# Patient Record
Sex: Male | Born: 1955
Health system: Southern US, Community
[De-identification: ages and names within clinical notes are randomized; demographics above are authoritative.]

## PROBLEM LIST (undated history)

## (undated) DIAGNOSIS — M1990 Unspecified osteoarthritis, unspecified site: Secondary | ICD-10-CM

## (undated) DIAGNOSIS — E119 Type 2 diabetes mellitus without complications: Secondary | ICD-10-CM

## (undated) DIAGNOSIS — F419 Anxiety disorder, unspecified: Secondary | ICD-10-CM

## (undated) DIAGNOSIS — Z87442 Personal history of urinary calculi: Secondary | ICD-10-CM

## (undated) DIAGNOSIS — J45909 Unspecified asthma, uncomplicated: Secondary | ICD-10-CM

## (undated) DIAGNOSIS — E7800 Pure hypercholesterolemia, unspecified: Secondary | ICD-10-CM

## (undated) DIAGNOSIS — I10 Essential (primary) hypertension: Secondary | ICD-10-CM

## (undated) HISTORY — DX: Unspecified asthma, uncomplicated: J45.909

## (undated) HISTORY — DX: Pure hypercholesterolemia, unspecified: E78.00

## (undated) HISTORY — PX: DENTAL SURGERY: SHX609

## (undated) HISTORY — DX: Essential (primary) hypertension: I10

## (undated) HISTORY — PX: JOINT REPLACEMENT: SHX530

## (undated) HISTORY — DX: Anxiety disorder, unspecified: F41.9

## (undated) HISTORY — DX: Type 2 diabetes mellitus without complications: E11.9

---

## 1998-03-03 ENCOUNTER — Emergency Department (HOSPITAL_COMMUNITY): Admission: EM | Admit: 1998-03-03 | Discharge: 1998-03-03 | Payer: Self-pay | Admitting: Emergency Medicine

## 1999-06-14 ENCOUNTER — Emergency Department (HOSPITAL_COMMUNITY): Admission: EM | Admit: 1999-06-14 | Discharge: 1999-06-14 | Payer: Self-pay | Admitting: Emergency Medicine

## 2009-08-11 ENCOUNTER — Encounter (INDEPENDENT_AMBULATORY_CARE_PROVIDER_SITE_OTHER): Payer: Self-pay | Admitting: *Deleted

## 2009-08-24 ENCOUNTER — Encounter (INDEPENDENT_AMBULATORY_CARE_PROVIDER_SITE_OTHER): Payer: Self-pay

## 2009-08-25 ENCOUNTER — Ambulatory Visit: Payer: Self-pay | Admitting: Gastroenterology

## 2009-09-03 ENCOUNTER — Ambulatory Visit: Payer: Self-pay | Admitting: Gastroenterology

## 2009-09-03 HISTORY — PX: COLONOSCOPY: SHX174

## 2009-11-19 ENCOUNTER — Ambulatory Visit: Payer: Self-pay | Admitting: Family Medicine

## 2009-11-19 ENCOUNTER — Emergency Department (HOSPITAL_COMMUNITY)
Admission: EM | Admit: 2009-11-19 | Discharge: 2009-11-19 | Payer: Self-pay | Source: Home / Self Care | Admitting: Emergency Medicine

## 2009-11-19 DIAGNOSIS — E785 Hyperlipidemia, unspecified: Secondary | ICD-10-CM | POA: Insufficient documentation

## 2009-11-19 DIAGNOSIS — R51 Headache: Secondary | ICD-10-CM | POA: Insufficient documentation

## 2009-11-19 DIAGNOSIS — R519 Headache, unspecified: Secondary | ICD-10-CM | POA: Insufficient documentation

## 2009-11-19 DIAGNOSIS — R030 Elevated blood-pressure reading, without diagnosis of hypertension: Secondary | ICD-10-CM | POA: Insufficient documentation

## 2009-11-26 ENCOUNTER — Encounter: Payer: Self-pay | Admitting: Family Medicine

## 2010-06-01 NOTE — Miscellaneous (Signed)
Summary: Lec previsit  Clinical Lists Changes  Medications: Added new medication of MOVIPREP 100 GM  SOLR (PEG-KCL-NACL-NASULF-NA ASC-C) As per prep instructions. - Signed Rx of MOVIPREP 100 GM  SOLR (PEG-KCL-NACL-NASULF-NA ASC-C) As per prep instructions.;  #1 x 0;  Signed;  Entered by: Ulis Rias RN;  Authorized by: Louis Meckel MD;  Method used: Electronically to [facility]. [place]. [phone]*, [address] [place], [place], [place]  [address], Ph: [phone] or [phone], Fax: [phone] Observations: Added new observation of NKA: T (08/25/2009 8:09)    Prescriptions: MOVIPREP 100 GM  SOLR (PEG-KCL-NACL-NASULF-NA ASC-C) As per prep instructions.  #1 x 0   Entered by:   Ulis Rias RN   Authorized by:   Louis Meckel MD   Signed by:   Ulis Rias RN on 08/25/2009   Method used:   Electronically to        [facility]. [address]. [phone]* (retail)       [address]       [place], [place]  [address]       Ph: [phone] or [phone]       Fax: [phone]   RxID:   [id number]

## 2010-06-01 NOTE — Procedures (Signed)
Summary: Colonoscopy  Patient: Alex Hunter Note: All result statuses are Final unless otherwise noted.  Tests: (1) Colonoscopy (COL)   COL Colonoscopy           DONE     [facility]     [address]     [place], [place]  [address]           COLONOSCOPY PROCEDURE REPORT           PATIENT:  Alex Hunter, Alex Hunter  MR#:  [id number]     BIRTHDATE:  Sep 18, 1955, 53 yrs. old  GENDER:  male           ENDOSCOPIST:  Barbette Hair. Arlyce Dice, MD     Referred by:           PROCEDURE DATE:  09/03/2009     PROCEDURE:  Diagnostic Colonoscopy     ASA CLASS:  Class I     INDICATIONS:  1) Routine Risk Screening           MEDICATIONS:   Fentanyl 25 mcg IV, Versed 3 mg IV           DESCRIPTION OF PROCEDURE:   After the risks benefits and     alternatives of the procedure were thoroughly explained, informed     consent was obtained.  Digital rectal exam was performed and     revealed no abnormalities.   The [id number] [id number] endoscope was     introduced through the anus and advanced to the cecum, which was     identified by both the appendix and ileocecal valve, without     limitations.  The quality of the prep was excellent, using     MoviPrep.  The instrument was then slowly withdrawn as the colon     was fully examined.     <<PROCEDUREIMAGES>>           FINDINGS:  A normal appearing cecum, ileocecal valve, and     appendiceal orifice were identified. The ascending, hepatic     flexure, transverse, splenic flexure, descending, sigmoid colon,     and rectum appeared unremarkable (see image1, image3, image4,     image5, image8, image9, image10, image13, image14, and image17).     Retroflexed views in the rectum revealed no abnormalities.    The     time to cecum =  2.0  minutes. The scope was then withdrawn (time     =  6.25  min) from the patient and the procedure completed.           COMPLICATIONS:  None           ENDOSCOPIC IMPRESSION:     1) Normal colon     RECOMMENDATIONS:     1)  Continue current colorectal screening recommendations for     "routine risk" patients with a repeat colonoscopy in 10 years.           REPEAT EXAM:  In 10 year(s) for Colonoscopy.           ______________________________     Barbette Hair. Arlyce Dice, MD           CC: Lynnea Ferrier, MD           n.     Rosalie Doctor:   Barbette Hair. Debbora Ang at 09/03/2009 10:50 AM           Nance Pew, [id number]  Note: An exclamation mark (!) indicates a result that was not dispersed into the flowsheet. Document  Creation Date: 09/03/2009 10:50 AM _______________________________________________________________________  (1) Order result status: Final Collection or observation date-time: 09/03/2009 10:44 Requested date-time:  Receipt date-time:  Reported date-time:  Referring Physician:   Ordering Physician: Melvia Heaps [phone]) Specimen Source:  Source: Launa Grill Order Number: [id number] Lab site:   Appended Document: Colonoscopy     Procedures Next Due Date:    Colonoscopy: 08/2019

## 2010-06-01 NOTE — Assessment & Plan Note (Signed)
Summary: HEADACHE,CHANGE IN VISION,LIGHT HEADED/TJ   Vital Signs:  Patient Profile:   55 Years Old Male CC:      Lightheaded, HA, x today O2 Sat:      100 % O2 treatment:    Room Air Temp:     97.1 degrees F oral Pulse rhythm:   regular Resp:     16 per minute BP sitting:   150 / 103  (right arm) Cuff size:   large  Vitals Entered By: Areta Haber CMA (November 19, 2009 6:44 PM)                  Current Allergies: No known allergies History of Present Illness Chief Complaint: Lightheaded, HA, x today History of Present Illness:  Subjective:  Patient had been working outside this morning.  At about 11AM he felt slightly dizzy and came inside.   He developed a brief scotomata associated with a brief expressive aphasia.  At the same time he began developing a frontal headache and he then took two Advils and an Aleve tab.   He took a nap and at [facility] he awoke with all symptoms having resolved. He states that he normally develops a similar headache about once per month, always resolving with Advil.  The headaches are often preceded by mild vision changes.  He states that he was treated in the past for HTN.  After he made dietary changes the med was discontinued and his blood pressure remained normal.  He states that he had his annual exam two months ago and his BP was normal.  He states that he had a normal screening colonoscopy about 3 months ago.  His cholesterol was found to be mildly elevated during his recent PE. He has a family history of cardiovascular disease but no one with strokes.  Current Problems: ELEVATED BP READING WITHOUT DX HYPERTENSION (ICD-796.2) HEADACHE (ICD-784.0) HYPERLIPIDEMIA (ICD-272.4)   Current Meds ADVIL 200 MG TABS (IBUPROFEN) as directed  REVIEW OF SYSTEMS Constitutional Symptoms      Denies fever, chills, night sweats, weight loss, weight gain, and fatigue.  Eyes       Complains of change in vision.      Denies eye pain, eye discharge, glasses,  contact lenses, and eye surgery. Ear/Nose/Throat/Mouth       Denies hearing loss/aids, change in hearing, ear pain, ear discharge, dizziness, frequent runny nose, frequent nose bleeds, sinus problems, sore throat, hoarseness, and tooth pain or bleeding.  Respiratory       Denies dry cough, productive cough, wheezing, shortness of breath, asthma, bronchitis, and emphysema/COPD.  Cardiovascular       Denies murmurs, chest pain, and tires easily with exhertion.    Gastrointestinal       Denies stomach pain, nausea/vomiting, diarrhea, constipation, blood in bowel movements, and indigestion. Genitourniary       Denies painful urination, kidney stones, and loss of urinary control. Neurological       Complains of headaches.      Denies paralysis, seizures, and fainting/blackouts. Musculoskeletal       Denies muscle pain, joint pain, joint stiffness, decreased range of motion, redness, swelling, muscle weakness, and gout.  Skin       Denies bruising, unusual mles/lumps or sores, and hair/skin or nail changes.  Psych       Denies mood changes, temper/anger issues, anxiety/stress, speech problems, depression, and sleep problems. Other Comments: Pt states he started feeling lightheaded earlier today, thought it was because he had not  eaten and had been working outside. Pt stated that he was talking but people could not understand him. Pt states he took to advil but still have a headache and feels lightheaded. Pt states he works at [employer] [address]   Past History:  Past Medical History: Hyperlipidemia  Past Surgical History: Denies surgical history  Social History: Regular exercise-yes Does Patient Exercise:  yes   Objective:  Appearance:  Patient appears healthy, stated age, and in no acute distress.  He is alert and oriented.  His speech and affect are normal.  Thoughts are organized Eyes:  Pupils are equal, round, and reactive to light and accomdation.  Extraocular movement  is intact.  Conjunctivae are not inflamed.  Fundi benign Ears:  Canals normal.  Tympanic membranes normal.   Nose:  Normal septum.  Normal turbinates, mildly congested.    No sinus tenderness present.  Mouth:  Tongue midline Pharynx:  Normal  Neck:  Supple.  No adenopathy is present.  No thyromegaly is present.  Carotids have normal upstrokes without bruits. Heart:  Regular rate and rhythm without murmurs, rubs, or gallops.  Lungs:  Clear to auscultation.  Breath sounds are equal.  Abdomen:  Nontender without masses or hepatosplenomegaly.  Bowel sounds are present.  No CVA or flank tenderness.  Extremities:  No edema.  Pedal pulses are full and equal.  No tenderness lower legs Neurologic:  Cranial nerves 2 through 12 are normal.  Patellar and elbow reflexes are normal.  Cerebellar function is intact.  Gait and station are normal.  Grip strength symmetric bilaterally.    Assessment New Problems: ELEVATED BP READING WITHOUT DX HYPERTENSION (ICD-796.2) HEADACHE (ICD-784.0) HYPERLIPIDEMIA (ICD-272.4)  NORMAL EXAM EXCEPT ELEVATED BP.  PATIENT'S SYMPTOMS APPEAR TO BE A RESULT OF MIGRAINE WITH AURA, BUT COULD ALSO HAVE REPRESENTED A TIA  Plan New Orders: New Patient Level III [99203] Planning Comments:   Advised to follow-up with PCP tomorrow for repeat BP measurement.  If symptoms recur worse during the night or over the weekend, proceed to the local emergency room.   Avoid sodium intake.  Avoid outside heat exposure and drink plenty of fluids.   The patient and/or caregiver has been counseled thoroughly with regard to medications prescribed including dosage, schedule, interactions, rationale for use, and possible side effects and they verbalize understanding.  Diagnoses and expected course of recovery discussed and will return if not improved as expected or if the condition worsens. Patient and/or caregiver verbalized understanding.   Orders Added: 1)  New Patient Level III [93235]

## 2010-06-01 NOTE — Letter (Signed)
Summary: [facility] Instructions  [facility]  [address] [place], [place] [address]   Phone: [phone]  Fax: [phone]       Alex Hunter    1956-03-31    MRN: [id number]        Procedure Day /Date:  Thursday 09/03/2009     Arrival Time: 9:00 am      Procedure Time: 10:00 am     Location of Procedure:                    _x _  [facility] (4th Floor)                        PREPARATION FOR COLONOSCOPY WITH MOVIPREP   Starting 5 days prior to your procedure Saturday 4/30 do not eat nuts, seeds, popcorn, corn, beans, peas,  salads, or any raw vegetables.  Do not take any fiber supplements (e.g. Metamucil, Citrucel, and Benefiber).  THE DAY BEFORE YOUR PROCEDURE         DATE: Wednesday 5/4 1.  Drink clear liquids the entire day-NO SOLID FOOD  2.  Do not drink anything colored red or purple.  Avoid juices with pulp.  No orange juice.  3.  Drink at least 64 oz. (8 glasses) of fluid/clear liquids during the day to prevent dehydration and help the prep work efficiently.  CLEAR LIQUIDS INCLUDE: Water Jello Ice Popsicles Tea (sugar ok, no milk/cream) Powdered fruit flavored drinks Coffee (sugar ok, no milk/cream) Gatorade Juice: apple, white grape, white cranberry  Lemonade Clear bullion, consomm, broth Carbonated beverages (any kind) Strained chicken noodle soup Hard Candy                             4.  In the morning, mix first dose of MoviPrep solution:    Empty 1 Pouch A and 1 Pouch B into the disposable container    Add lukewarm drinking water to the top line of the container. Mix to dissolve    Refrigerate (mixed solution should be used within 24 hrs)  5.  Begin drinking the prep at 5:00 p.m. The MoviPrep container is divided by 4 marks.   Every 15 minutes drink the solution down to the next mark (approximately 8 oz) until the full liter is complete.   6.  Follow completed prep with 16 oz of clear liquid of your choice  (Nothing red or purple).  Continue to drink clear liquids until bedtime.  7.  Before going to bed, mix second dose of MoviPrep solution:    Empty 1 Pouch A and 1 Pouch B into the disposable container    Add lukewarm drinking water to the top line of the container. Mix to dissolve    Refrigerate  THE DAY OF YOUR PROCEDURE      DATE: Thursday 5/5  Beginning at 5:00 a.m. (5 hours before procedure):         1. Every 15 minutes, drink the solution down to the next mark (approx 8 oz) until the full liter is complete.  2. Follow completed prep with 16 oz. of clear liquid of your choice.    3. You may drink clear liquids until 8:00 am (2 HOURS BEFORE PROCEDURE).   MEDICATION INSTRUCTIONS  Unless otherwise instructed, you should take regular prescription medications with a small sip of water   as early as possible the morning of your  procedure.         OTHER INSTRUCTIONS  You will need a responsible adult at least 54 years of age to accompany you and drive you home.   This person must remain in the waiting room during your procedure.  Wear loose fitting clothing that is easily removed.  Leave jewelry and other valuables at home.  However, you may wish to bring a book to read or  an iPod/MP3 player to listen to music as you wait for your procedure to start.  Remove all body piercing jewelry and leave at home.  Total time from sign-in until discharge is approximately 2-3 hours.  You should go home directly after your procedure and rest.  You can resume normal activities the  day after your procedure.  The day of your procedure you should not:   Drive   Make legal decisions   Operate machinery   Drink alcohol   Return to work  You will receive specific instructions about eating, activities and medications before you leave.    The above instructions have been reviewed and explained to me by   Ulis Rias RN  August 25, 2009 8:47 AM     I fully understand and can  verbalize these instructions _____________________________ Date _________

## 2010-06-01 NOTE — Letter (Signed)
Summary: Previsit letter  [facility]  [address] [place], [place] [address]   Phone: [phone]  Fax: [phone]       08/11/2009 MRN: [id number]  Alex Hunter [address] [place], [place]  [address]  Dear Alex Hunter,  Welcome to the Gastroenterology Division at [facility].    You are scheduled to see a nurse for your pre-procedure visit on 08-25-09 at 8:30a.m. on the 3rd floor at [facility], [address]  We ask thatyou try to arrive at our office 15 minutes prior to your appointment time to allow for check-in.  Your nurse visit will consist of discussing your medical and surgical history, your immediate family medical history, and your medications.    Please bring a complete list of all your medications or, if you prefer, bring the medication bottles and we will list them.  We will need to be aware of both prescribed and over the counter drugs.  We will need to know exact dosage information as well.  If you are on blood thinners (Coumadin, Plavix, Aggrenox, Ticlid, etc.) please call our office today/prior to your appointment, as we need to consult with your physician about holding your medication.   Please be prepared to read and sign documents such as consent forms, a financial agreement, and acknowledgement forms.  If necessary, and with your consent, a friend or relative is welcome to sit-in on the nurse visit with you.  Please bring your insurance card so that we may make a copy of it.  If your insurance requires a referral to see a specialist, please bring your referral form from your primary care physician.  No co-pay is required for this nurse visit.     If you cannot keep your appointment, please call [phone] to cancel or reschedule prior to your appointment date.  This allows [place] the opportunity to schedule an appointment for another patient in need of care.    Thank you for choosing [facility] for your medical  needs.  We appreciate the opportunity to care for you.  Please visit [place] at our website  to learn more about our practice.                     Sincerely.                                                                                                                   The Gastroenterology Division

## 2010-06-01 NOTE — Letter (Signed)
Summary: External Correspondence  External Correspondence   Imported By: Dannette Barbara 11/26/2009 09:56:29  _____________________________________________________________________  External Attachment:    Type:   Image     Comment:   External Document

## 2010-06-15 ENCOUNTER — Emergency Department (HOSPITAL_COMMUNITY)
Admission: EM | Admit: 2010-06-15 | Discharge: 2010-06-15 | Disposition: A | Discharge status: Home / Self Care | Payer: Self-pay | Attending: Emergency Medicine | Admitting: Emergency Medicine

## 2010-06-15 DIAGNOSIS — L500 Allergic urticaria: Secondary | ICD-10-CM | POA: Insufficient documentation

## 2010-06-15 DIAGNOSIS — E7800 Pure hypercholesterolemia, unspecified: Secondary | ICD-10-CM | POA: Insufficient documentation

## 2011-10-04 ENCOUNTER — Emergency Department (HOSPITAL_COMMUNITY)
Admission: EM | Admit: 2011-10-04 | Discharge: 2011-10-05 | Disposition: A | Discharge status: Home / Self Care | Payer: Self-pay | Attending: Emergency Medicine | Admitting: Emergency Medicine

## 2011-10-04 ENCOUNTER — Emergency Department (HOSPITAL_COMMUNITY): Payer: Self-pay

## 2011-10-04 ENCOUNTER — Encounter (HOSPITAL_COMMUNITY): Payer: Self-pay | Admitting: *Deleted

## 2011-10-04 DIAGNOSIS — R51 Headache: Secondary | ICD-10-CM | POA: Insufficient documentation

## 2011-10-04 DIAGNOSIS — J40 Bronchitis, not specified as acute or chronic: Secondary | ICD-10-CM | POA: Insufficient documentation

## 2011-10-04 MED ORDER — ALBUTEROL SULFATE HFA 108 (90 BASE) MCG/ACT IN AERS
2.0000 | INHALATION_SPRAY | Freq: Once | RESPIRATORY_TRACT | Status: AC
  Administered 2011-10-04: 2 via RESPIRATORY_TRACT
  Filled 2011-10-04: qty 6.7

## 2011-10-04 NOTE — ED Notes (Signed)
Pt reports "feeling like I have asthma and can't get enough air, and I have had a lot of headaches recently." Pt denies complaints of chest pain. States "feels like congestion" pt in NAD. Breath sounds clear.

## 2011-10-04 NOTE — ED Notes (Signed)
Pt states, "last night I felt like I was having a bad dream having a panic attack, but when I woke I still felt like I couldn't catch my breath...like I had anxiety or something."  Pt reports he called 911 last night and when EMS arrived they put oxygen on him, but he refused transport.

## 2011-10-04 NOTE — Discharge Instructions (Signed)

## 2011-10-04 NOTE — ED Provider Notes (Signed)
History     CSN: [id number]  Arrival date & time 10/04/11  2059   First MD Initiated Contact with Patient 10/04/11 2228      Chief Complaint  Patient presents with  . Headache  . Shortness of Breath    (Consider location/radiation/quality/duration/timing/severity/associated sxs/prior treatment) The history is provided by the patient.   the patient reports yesterday he name he had fallen asleep and had had a terrible to ream and awoke and began having a panic attack.  EMS was called out to the patient's house and he began feeling better and did not come the emergency department today.  He spoke with multiple friends who stated that he should come to the emergency department for evaluation per is a panic attack.  Today the patient has been without chest pain shortness of breath.  He does report chest congestion.  He denies fevers and chills.  At this time he feels rather well.  He does have a history of asthma when he was a child but no longer uses albuterol.  Nothing worsens the symptoms.  Nothing improves his symptoms.  His symptoms are mild in severity.  He has a history of coronary artery disease.  He denies history of DVT or pulmonary embolism.  He denies recent long travel or surgery.  He's had no unilateral leg swelling.  History reviewed. No pertinent past medical history.  History reviewed. No pertinent past surgical history.  History reviewed. No pertinent family history.  History  Substance Use Topics  . Smoking status: Not on file  . Smokeless tobacco: Not on file  . Alcohol Use: Yes     occ      Review of Systems  Respiratory: Positive for shortness of breath.   Neurological: Positive for headaches.  All other systems reviewed and are negative.    Allergies  Review of patient's allergies indicates no known allergies.  Home Medications  No current outpatient prescriptions on file.  BP 131/70  Pulse 82  Temp(Src) 97.7 F (36.5 C) (Oral)  Resp 18  SpO2  97%  Physical Exam  Nursing note and vitals reviewed. Constitutional: He is oriented to person, place, and time. He appears well-developed and well-nourished.  HENT:  Head: Normocephalic and atraumatic.  Eyes: EOM are normal.  Neck: Normal range of motion.  Cardiovascular: Normal rate, regular rhythm, normal heart sounds and intact distal pulses.   Pulmonary/Chest: Effort normal and breath sounds normal. No respiratory distress.  Abdominal: Soft. He exhibits no distension. There is no tenderness.  Musculoskeletal: Normal range of motion.  Neurological: He is alert and oriented to person, place, and time.  Skin: Skin is warm and dry.  Psychiatric: He has a normal mood and affect. Judgment normal.    ED Course  Procedures (including critical care time)  Labs Reviewed - No data to display Dg Chest 2 View  10/04/2011  *RADIOLOGY REPORT*  Clinical Data: Headaches and shortness of breath.  CHEST - 2 VIEW  Comparison: No priors.  Findings: Lung volumes are normal.  No consolidative airspace disease.  No pleural effusions.  No pneumothorax.  No pulmonary nodule or mass noted.  Pulmonary vasculature and the cardiomediastinal silhouette are within normal limits.  IMPRESSION: 1. No radiographic evidence of acute cardiopulmonary disease.  Original Report Authenticated By: Florencia Reasons, M.D.     1. Bronchitis       MDM  The patient's episode last night sounds like a panic attack.  Today he is without symptoms except for  chest congestion.  Chest x-ray without infiltrate.  This is likely bronchitis.  Patient is given albuterol for cough.  Discharge home in good condition  The patient understands return the emergency department for new or worsening symptoms        Lyanne Co, MD 10/04/11 2344

## 2012-03-06 ENCOUNTER — Encounter: Payer: Self-pay | Admitting: Internal Medicine

## 2012-03-06 ENCOUNTER — Ambulatory Visit (INDEPENDENT_AMBULATORY_CARE_PROVIDER_SITE_OTHER): Payer: Self-pay | Admitting: Internal Medicine

## 2012-03-06 VITALS — BP 142/100 | HR 73 | Temp 98.0°F | Resp 16 | Ht 66.0 in | Wt 215.0 lb

## 2012-03-06 DIAGNOSIS — K611 Rectal abscess: Secondary | ICD-10-CM

## 2012-03-06 DIAGNOSIS — E785 Hyperlipidemia, unspecified: Secondary | ICD-10-CM

## 2012-03-06 DIAGNOSIS — I10 Essential (primary) hypertension: Secondary | ICD-10-CM

## 2012-03-06 DIAGNOSIS — K612 Anorectal abscess: Secondary | ICD-10-CM

## 2012-03-06 DIAGNOSIS — Z23 Encounter for immunization: Secondary | ICD-10-CM

## 2012-03-06 MED ORDER — AMOXICILLIN-POT CLAVULANATE 875-125 MG PO TABS: 1.0000 | ORAL_TABLET | Freq: Two times a day (BID) | ORAL | Status: AC

## 2012-03-06 NOTE — Progress Notes (Signed)
  Subjective:    Patient ID: Alex Hunter, male    DOB: 1955-09-13, 56 y.o.   MRN: [id number]  HPI patient presents to clinic to establish primary medical care. Needs adoption physical form completed and PPD placed and read. No known exposure to TB no cough. Blood pressure elevated rechecked to be 136/92. States has been told was elevated in the past likely consistent with hypertension. Is taking no medication for the problem. States he did just eat foods high in sodium. Complains of a cyst in the pararectal area intermittently draining most recently over the past 2 months. No fever or chills. Recalls colonoscopy may 2011 normal. Declines influenza vaccine. States he intends to obtain medical insurance in January  No past medical history on file. No past surgical history on file.  reports that he has quit smoking. His smoking use included Cigarettes. He does not have any smokeless tobacco history on file. He reports that he drinks alcohol. His drug history not on file. family history is not on file. No Known Allergies   Review of Systems  Respiratory: Negative for cough and shortness of breath.   Cardiovascular: Negative for chest pain.  Neurological: Negative for weakness and headaches.  All other systems reviewed and are negative.       Objective:   Physical Exam  Nursing note and vitals reviewed. Constitutional: He appears well-developed and well-nourished.  HENT:  Head: Normocephalic and atraumatic.  Eyes: Conjunctivae normal and EOM are normal. Pupils are equal, round, and reactive to light. No scleral icterus.  Neck: Neck supple. Carotid bruit is not present. No thyromegaly present.  Cardiovascular: Normal rate, regular rhythm and normal heart sounds.  Exam reveals no gallop and no friction rub.   No murmur heard. Pulmonary/Chest: Effort normal and breath sounds normal. No respiratory distress. He has no wheezes. He has no rales.  Lymphadenopathy:    He has no cervical  adenopathy.  Neurological: He is alert.  Skin: Skin is warm and dry.  Psychiatric: He has a normal mood and affect.   rectal: At 10:00 position small punctate opening but no expressible discharge. Nontender. Slight induration. No obvious communication with the rectum.        Assessment & Plan:

## 2012-03-06 NOTE — Assessment & Plan Note (Signed)
Low-fat diet exercise weight loss. Check lipid profile with next visit.

## 2012-03-06 NOTE — Assessment & Plan Note (Signed)
Repeat blood pressure mildly elevated. Recommend low-sodium diet regular aerobic exercise at least 3-4 days a week and weight loss. Recommend out patient blood pressure monitoring and record the results for review. Schedule followup. Notify clinic if blood pressure begins to elevate further or develops associated symptoms.

## 2012-03-06 NOTE — Assessment & Plan Note (Signed)
Mild. Not amenable to I&D. Attempt Augmentin. Followup if no improvement or worsening.

## 2012-03-07 NOTE — Addendum Note (Signed)
Addended by: Regis Bill on: 03/07/2012 05:38 PM   Modules accepted: Orders

## 2012-03-08 ENCOUNTER — Ambulatory Visit (INDEPENDENT_AMBULATORY_CARE_PROVIDER_SITE_OTHER): Payer: Self-pay | Admitting: Internal Medicine

## 2012-03-08 DIAGNOSIS — Z111 Encounter for screening for respiratory tuberculosis: Secondary | ICD-10-CM

## 2012-03-08 LAB — TB SKIN TEST: TB Skin Test: NEGATIVE

## 2012-03-08 NOTE — Progress Notes (Signed)
  Subjective:    Patient ID: CHIEF WALKUP, male    DOB: [birth year], 56 y.o.   MRN: [id number]  HPI The patient presented to the office for assessment of PPD skin test placed on 03/05/12.   Review of Systems     Objective:   Physical Exam  No redness or induration noted.      Assessment & Plan:   Negative PPD skin test. Advised pt to call the office if he experiences any irritation of test site.

## 2012-05-11 ENCOUNTER — Ambulatory Visit: Payer: Self-pay | Admitting: Internal Medicine

## 2012-05-11 DIAGNOSIS — Z0289 Encounter for other administrative examinations: Secondary | ICD-10-CM

## 2013-01-04 ENCOUNTER — Telehealth: Payer: Self-pay

## 2013-01-04 NOTE — Telephone Encounter (Signed)
FYI:  Pt called asking me to give him Dr Hodgin's medical numbers for his adoption paperwork. I informed pt that Dr Rodena Medin is out on medical leave and he would need the adoption agency to fax the paperwork to [place]. I informed him that he had only been seen once (03-06-12) and Dr Rodena Medin asked him to return around 05-06-12 and he no showed that appt and hasn't been seen since, so MD may need him to come in so she can fill out this paperwork.  Pt stated he would fax the paperwork

## 2013-07-20 ENCOUNTER — Ambulatory Visit (INDEPENDENT_AMBULATORY_CARE_PROVIDER_SITE_OTHER): Payer: BC Managed Care – PPO | Admitting: Emergency Medicine

## 2013-07-20 VITALS — BP 132/81 | HR 74 | Temp 97.9°F | Resp 18 | Ht 66.0 in | Wt 221.0 lb

## 2013-07-20 DIAGNOSIS — M25519 Pain in unspecified shoulder: Secondary | ICD-10-CM

## 2013-07-20 MED ORDER — NAPROXEN SODIUM 550 MG PO TABS: 550.0000 mg | ORAL_TABLET | Freq: Two times a day (BID) | ORAL | Status: AC

## 2013-07-20 NOTE — Progress Notes (Signed)
[facility] [address] [place] [address] [phone]  Date:  07/20/2013   Name:  Alex Hunter   DOB:  07-14-1955   MRN:  [id number]  PCP:  Jeb Levering, Philbert Riser, MD    Chief Complaint: Shoulder Pain   History of Present Illness:  Alex Hunter is a 58 y.o. very pleasant male patient who presents with the following:  No history of injury.  Has pain in right biceps into proximal right forearm.  No neuro symptoms or weakness.  Can't raise arm past 90 degrees without pain. No neck injury or pain.  No improvement with over the counter medications or other home remedies. Denies other complaint or health concern today.   Patient Active Problem List   Diagnosis Date Noted  . Unspecified essential hypertension 03/06/2012  . Perirectal abscess 03/06/2012  . HYPERLIPIDEMIA 11/19/2009    No past medical history on file.  No past surgical history on file.  History  Substance Use Topics  . Smoking status: Former Smoker    Types: Cigarettes  . Smokeless tobacco: Not on file  . Alcohol Use: Yes     Comment: occ    Family History  Problem Relation Age of Onset  . Hyperlipidemia Mother     No Known Allergies  Medication list has been reviewed and updated.  No current outpatient prescriptions on file prior to visit.   No current facility-administered medications on file prior to visit.    Review of Systems:  As per HPI, otherwise negative.    Physical Examination: Filed Vitals:   07/20/13 1150  BP: 132/81  Pulse: 74  Temp: 97.9 F (36.6 C)  Resp: 18   Filed Vitals:   07/20/13 1150  Height: 5\' 6"  (1.676 m)  Weight: 221 lb (100.245 kg)   Body mass index is 35.69 kg/(m^2). Ideal Body Weight: Weight in (lb) to have BMI = 25: 154.6   GEN: WDWN, NAD, Non-toxic, Alert & Oriented x 3 HEENT: Atraumatic, Normocephalic.  Ears and Nose: No external deformity. EXTR: No clubbing/cyanosis/edema NEURO: Normal gait.  PSYCH:  Normally interactive. Conversant. Not depressed or anxious appearing.  Calm demeanor.  RIGHT arm:  Pain with abduction arm to 90 degrees. Tender shoulder anteriorly.  No crepitus or ecchymosis.  No biceps tenderness  Assessment and Plan: Right shoulder bursitis vs cervical radiculitis Anaprox Follow up in one month   Signed,  Ellison Carwin, MD

## 2013-07-20 NOTE — Patient Instructions (Signed)
Bursitis Bursitis is a swelling and soreness (inflammation) of a fluid-filled sac (bursa) that overlies and protects a joint. It can be caused by injury, overuse of the joint, arthritis or infection. The joints most likely to be affected are the elbows, shoulders, hips and knees. HOME CARE INSTRUCTIONS   Apply ice to the affected area for 15-20 minutes each hour while awake for 2 days. Put the ice in a plastic bag and place a towel between the bag of ice and your skin.  Rest the injured joint as much as possible, but continue to put the joint through a full range of motion, 4 times per day. (The shoulder joint especially becomes rapidly "frozen" if not used.) When the pain lessens, begin normal slow movements and usual activities.  Only take over-the-counter or prescription medicines for pain, discomfort or fever as directed by your caregiver.  Your caregiver may recommend draining the bursa and injecting medicine into the bursa. This may help the healing process.  Follow all instructions for follow-up with your caregiver. This includes any orthopedic referrals, physical therapy and rehabilitation. Any delay in obtaining necessary care could result in a delay or failure of the bursitis to heal and chronic pain. SEEK IMMEDIATE MEDICAL CARE IF:   Your pain increases even during treatment.  You develop an oral temperature above 102 F (38.9 C) and have heat and inflammation over the involved bursa. MAKE SURE YOU:   Understand these instructions.  Will watch your condition.  Will get help right away if you are not doing well or get worse. Document Released: 04/15/2000 Document Revised: 07/11/2011 Document Reviewed: 03/20/2009 [employer] Patient Information 2014 [employer].  

## 2015-02-01 ENCOUNTER — Encounter (HOSPITAL_COMMUNITY): Payer: Self-pay | Admitting: *Deleted

## 2015-02-01 ENCOUNTER — Emergency Department (HOSPITAL_COMMUNITY)
Admission: EM | Admit: 2015-02-01 | Discharge: 2015-02-01 | Disposition: A | Discharge status: Home / Self Care | Payer: Self-pay | Attending: Emergency Medicine | Admitting: Emergency Medicine

## 2015-02-01 DIAGNOSIS — M6289 Other specified disorders of muscle: Secondary | ICD-10-CM

## 2015-02-01 DIAGNOSIS — Z87891 Personal history of nicotine dependence: Secondary | ICD-10-CM | POA: Insufficient documentation

## 2015-02-01 DIAGNOSIS — M25551 Pain in right hip: Secondary | ICD-10-CM | POA: Insufficient documentation

## 2015-02-01 DIAGNOSIS — M7918 Myalgia, other site: Secondary | ICD-10-CM

## 2015-02-01 DIAGNOSIS — R29898 Other symptoms and signs involving the musculoskeletal system: Secondary | ICD-10-CM

## 2015-02-01 DIAGNOSIS — M545 Low back pain: Secondary | ICD-10-CM | POA: Insufficient documentation

## 2015-02-01 NOTE — Discharge Instructions (Signed)
Piriformis Syndrome with Rehab Piriformis syndrome is a condition the affects the nervous system in the area of the hip, and is characterized by pain and possibly a loss of feeling in the backside (posterior) thigh that may extend down the entire length of the leg. The symptoms are caused by an increase in pressure on the sciatic nerve by the piriformis muscle, which is on the back of the hip and is responsible for externally rotating the hip. The sciatic nerve and its branches connect to much of the leg. Normally the sciatic nerve runs between the piriformis muscle and other muscles. However, in certain individuals the nerve runs through the muscle, which causes an increase in pressure on the nerve and results in the symptoms of piriformis syndrome. SYMPTOMS   Pain, tingling, numbness, or burning in the back of the thigh that may also extend down the entire leg.  Occasionally, tenderness in the buttock.  Loss of function of the leg.  Pain that worsens when using the piriformis muscle (running, jumping, or stairs).  Pain that increases with prolonged sitting.  Pain that is lessened by lying flat on the back. CAUSES   Piriformis syndrome is the result of an increase in pressure placed on the sciatic nerve. Oftentimes, piriformis syndrome is an overuse injury.  Stress placed on the nerve from a sudden increase in the intensity, frequency, or duration of training.  Compensation of other extremity injuries. RISK INCREASES WITH:  Sports that involve the piriformis muscle (running, walking, or jumping).  You are born with (congenital) a defect in which the sciatic nerve passes through the muscle. PREVENTION  Warm up and stretch properly before activity.  Allow for adequate recovery between workouts.  Maintain physical fitness:  Strength, flexibility, and endurance.  Cardiovascular fitness. PROGNOSIS  If treated properly, the symptoms of piriformis syndrome usually resolve in 2 to 6  weeks. RELATED COMPLICATIONS   Persistent and possibly permanent pain and numbness in the lower extremity.  Weakness of the extremity that may progress to disability and inability to compete. TREATMENT  The most effective treatment for piriformis syndrome is rest from any activities that aggravate the symptoms. Ice and pain medication may help reduce pain and inflammation. The use of strengthening and stretching exercises may help reduce pain with activity. These exercises may be performed at home or with a therapist. A referral to a therapist may be given for further evaluation and treatment, such as ultrasound. Corticosteroid injections may be given to reduce inflammation that is causing pressure to be placed on the sciatic nerve. If nonsurgical (conservative) treatment is unsuccessful, then surgery may be recommended.  MEDICATION   If pain medication is necessary, then nonsteroidal anti-inflammatory medications, such as aspirin and ibuprofen, or other minor pain relievers, such as acetaminophen, are often recommended.  Do not take pain medication for 7 days before surgery.  Prescription pain relievers may be given if deemed necessary by your caregiver. Use only as directed and only as much as you need.  Corticosteroid injections may be given by your caregiver. These injections should be reserved for the most serious cases, because they may only be given a certain number of times. HEAT AND COLD:   Cold treatment (icing) relieves pain and reduces inflammation. Cold treatment should be applied for 10 to 15 minutes every 2 to 3 hours for inflammation and pain and immediately after any activity that aggravates your symptoms. Use ice packs or massage the area with a piece of ice (ice massage).  Heat  treatment may be used prior to performing the stretching and strengthening activities prescribed by your caregiver, physical therapist, or athletic trainer. Use a heat pack or soak the injury in warm  water. SEEK IMMEDIATE MEDICAL CARE IF:  Treatment seems to offer no benefit, or the condition worsens.  Any medications produce adverse side effects. EXERCISES RANGE OF MOTION (ROM) AND STRETCHING EXERCISES - Piriformis Syndrome These exercises may help you when beginning to rehabilitate your injury. Your symptoms may resolve with or without further involvement from your physician, physical therapist, or athletic trainer. While completing these exercises, remember:   Restoring tissue flexibility helps normal motion to return to the joints. This allows healthier, less painful movement and activity.  An effective stretch should be held for at least 30 seconds.  A stretch should never be painful. You should only feel a gentle lengthening or release in the stretched tissue. STRETCH - Hip Rotators  Lie on your back on a firm surface. Grasp your right / left knee with your right / left hand and your ankle with your opposite hand.  Keeping your hips and shoulders firmly planted, gently pull your right / left knee and rotate your lower leg toward your opposite shoulder until you feel a stretch in your buttocks.  Hold this stretch for __________ seconds. Repeat this stretch __________ times. Complete this stretch __________ times per day. STRETCH - Iliotibial Band  On the floor or bed, lie on your side so your right / left leg is on top. Bend your knee and grab your ankle.  Slowly bring your knee back so that your thigh is in line with your trunk. Keep your heel at your buttocks and gently arch your back so your head, shoulders, and hips line up.  Slowly lower your leg so that your knee approaches the floor/bed until you feel a gentle stretch on the outside of your right / left thigh. If you do not feel a stretch and your knee will not fall farther, place the heel of your opposite foot on top of your knee and pull your thigh down farther.  Hold this stretch for __________ seconds. Repeat  __________ times. Complete __________ times per day. STRENGTHENING EXERCISES - Piriformis Syndrome  These are some of the caregiver again or until your symptoms are resolved. Remember:   Strong muscles with good endurance tolerate stress better.  Do the exercises as initially prescribed by your caregiver. Progress slowly with each exercise, gradually increasing the number of repetitions and weight used under their guidance. STRENGTH - Hip Abductors, Straight Leg Raises Be aware of your form throughout the entire exercise so that you exercise the correct muscles. Sloppy form means that you are not strengthening the correct muscles.  Lie on your side so that your head, shoulders, knee, and hip line up. You may bend your lower knee to help maintain your balance. Your right / left leg should be on top.  Roll your hips slightly forward, so that your hips are stacked directly over each other and your right / left knee is facing forward.  Lift your top leg up 4-6 inches, leading with your heel. Be sure that your foot does not drift forward or that your knee does not roll toward the ceiling.  Hold this position for __________ seconds. You should feel the muscles in your outer hip lifting (you may not notice this until your leg begins to tire).  Slowly lower your leg to the starting position. Allow the muscles to fully   relax before beginning the next repetition. Repeat __________ times. Complete this exercise __________ times per day.  STRENGTH - Hip Abductors, Quadruped  On a firm, lightly padded surface, position yourself on your hands and knees. Your hands should be directly below your shoulders and your knees should be directly below your hips.  Keeping your right / left knee bent, lift your leg out to the side. Keep your legs level and in line with your shoulders.  Position yourself on your hands and knees.  Hold for __________ seconds.  Keeping your trunk steady and your hips level, slowly  lower your leg to the starting position. Repeat __________ times. Complete this exercise __________ times per day.  STRENGTH - Hip Abductors, Standing  Tie one end of a rubber exercise band/tubing to a secure surface (table, pole) and tie a loop at the other end.  Place the loop around your right / left ankle. Keeping your ankle with the band directly opposite of the secured end, step away until there is tension in the tube/band.  Hold onto a chair as needed for balance.  Keeping your back upright, your shoulders over your hips, and your toes pointing forward, lift your right / left leg out to your side. Be sure to lift your leg with your hip muscles. Do not "throw" your leg or tip your body to lift your leg.  Slowly and with control, return to the starting position. Repeat exercise __________ times. Complete this exercise __________ times per day.  Document Released: 04/18/2005 Document Revised: 09/02/2013 Document Reviewed: 07/31/2008 [facility] Patient Information 2015 [place], [place]. This information is not intended to replace advice given to you by your health care provider. Make sure you discuss any questions you have with your health care provider. Iliotibial Band Syndrome Iliotibial band syndrome is pain in the outer, lower thigh. The pain is caused by an inflammation of the iliotibial band. This is a band of thick fibrous tissue that runs down the outside of the thigh. The iliotibial band begins at the hip. It extends to the outer side of the shin bone (tibia) just below the knee joint. The band works with the thigh muscles. Together they provide stability to the outside of the knee joint. Iliotibial band syndrome occurs when there is inflammation to this band of tissue. This is typically due to over use and not due to an injury. The irritation usually occurs over the outside of the knee joint, at the the end of the thigh bone (femur). The iliotibial band crosses bone and muscle at this  point. Between these structures is a cushioning sac (bursa). The bursa should make possible a smooth gliding motion. However, when inflamed, the iliotibial band does not glide easily. When inflamed, there is pain with motion of the knee. Usually the pain worsens with continued movement and the pain goes away with rest. This problem usually arises when there is a sudden increase in sports activities involving your legs. Running, and playing soccer or basketball are examples of activities causing this. Others who are prone to iliotibial band syndrome include individuals with mechanical problems such as leg length differences, abnormality of walking, bowed legs etc. HOME CARE INSTRUCTIONS   Apply ice to the injured area:  Put ice in a plastic bag.  Place a towel between your skin and the bag.  Leave the ice on for 20 minutes, 2-3 times a day.  Limit excessive training or eliminate training until pain goes away.  While pain is present, you  may use gentle range of motion. Do not resume regular use until instructed by your health care provider. Begin use gradually. Do not increase activity to the point of pain. If pain does develop, decrease activity and continue the above measures. Gradually increase activities that do not cause discomfort. Do this until you finally achieve normal use.  Perform low-impact activities while pain is present. Wear proper footwear.  Only take over-the-counter or prescription medicines for pain, discomfort, or fever as directed by your health care provider. SEEK MEDICAL CARE IF:   Your pain increases or pain is not controlled with medications.  You develop new, unexplained symptoms, or an increase of the symptoms that brought you to your health care provider.  Your pain and symptoms are not improving or are getting worse. Document Released: 10/08/2001 Document Revised: 02/06/2013 Document Reviewed: 11/15/2012 [facility] Patient Information 2015 [place], [place]. This  information is not intended to replace advice given to you by your health care provider. Make sure you discuss any questions you have with your health care provider.

## 2015-02-01 NOTE — ED Notes (Addendum)
Pt reports 7 months ago right back of leg pain, pt has been to urgent care and pcp for leg pain. Pt reports if pt walks for 20 minutes the pain radiates from right hip to right knee. Pain has increased over time. Pain 2/10 at present. Denies numbness or tingling. Pt reports for a long time he has put wallet in back right pocket of pants and sits with wallet in pocket.   Pt also has a concern of feeling "drainage in his rectum", but pt has never actually seen drainage come out of his rectum. Was seen by pcp in Feburary, was given abx and completed medication.

## 2015-02-01 NOTE — ED Notes (Signed)
Pt escorted to discharge window. Pt verbalized understanding discharge instructions. In no acute distress.  

## 2015-02-01 NOTE — ED Notes (Signed)
Pt alert and oriented x4. Respirations even and unlabored, bilateral symmetrical rise and fall of chest. Skin warm and dry. In no acute distress. Denies needs.   

## 2015-02-01 NOTE — ED Provider Notes (Signed)
CSN: [id number]     Arrival date & time 02/01/15  1000 History   First MD Initiated Contact with Patient 02/01/15 1015     Chief Complaint  Patient presents with  . Leg Pain     (Consider location/radiation/quality/duration/timing/severity/associated sxs/prior Treatment) HPI   Alex Hunter Is a 59 year old male who presents emergency Department with chief complaint of right buttock and hip pain. Patient states that he has had ongoing pain in his right buttock and hip. He states it is better when he wakes up in the morning, worse after walking around all day at work. He states he usually takes Aleve which helps but does not completely make it go away. He has significant relief when he sits down or raises her leg up. He denies back pain, urinary incontinence or bowel incontinence, numbness in his perineal region, weakness in the legs. He describes the pain as aching, he has associated cramping at night, he still points to his right buttock and down the back of his hamstring. He states it stops at the knee. He denies any shooting pain, paresthesia. Patient states that he does not have any drainage from his rectum, pain, pain with defecation. He declines examination today  History reviewed. No pertinent past medical history. History reviewed. No pertinent past surgical history. Family History  Problem Relation Age of Onset  . Hyperlipidemia Mother    Social History  Substance Use Topics  . Smoking status: Former Smoker    Types: Cigarettes  . Smokeless tobacco: None  . Alcohol Use: Yes     Comment: occ    Review of Systems   Ten systems reviewed and are negative for acute change, except as noted in the HPI.   Allergies  Review of patient's allergies indicates no known allergies.  Home Medications   Prior to Admission medications   Medication Sig Start Date End Date Taking? Authorizing Provider  naproxen sodium (ANAPROX) 220 MG tablet Take 440 mg by mouth daily as needed  (pain).   Yes Historical Provider, MD   BP 151/92 mmHg  Pulse 81  Temp(Src) 98.2 F (36.8 C) (Oral)  Resp 16  SpO2 99% Physical Exam  Constitutional: He is oriented to person, place, and time. He appears well-developed and well-nourished. No distress.  HENT:  Head: Normocephalic and atraumatic.  Eyes: Conjunctivae are normal. No scleral icterus.  Neck: Normal range of motion. Neck supple.  Cardiovascular: Normal rate, regular rhythm and normal heart sounds.   Pulmonary/Chest: Effort normal and breath sounds normal. No respiratory distress.  Abdominal: Soft. There is no tenderness.  Musculoskeletal: He exhibits no edema.  Negative straight leg raise on the right Normal DTRs, normal gait. Range of motion of the hips bilaterally is limited due to significant immobility and tightness. This is worse on the right.  Neurological: He is alert and oriented to person, place, and time.  Skin: Skin is warm and dry. He is not diaphoretic.  Psychiatric: His behavior is normal.  Nursing note and vitals reviewed.   ED Course  Procedures (including critical care time) Labs Review Labs Reviewed - No data to display  Imaging Review No results found. I have personally reviewed and evaluated these images and lab results as part of my medical decision-making.   EKG Interpretation None      MDM   Final diagnoses:  Hamstring tightness of right lower extremity  Pain in right buttock  Hip tightness    Patient with significant tightness and immobility in the hip  region. I feel that his symptoms are likely related to buy a mechanical imbalance, and a tendinitis-like syndrome. This does not appear at all similar to sciatica. He has no neurologic deficits. Patient showed multiple hip stretches at this visit and had significant relief of his symptoms immediately. Patient will be discharged with instructions to warm up, stretched hips twice daily, follow up with sports medicine specialist for  further evaluation. He appears safe for discharge at this time    Margarita Mail, PA-C 02/01/15 [place], MD 02/01/15 1535

## 2015-05-07 ENCOUNTER — Encounter: Payer: Self-pay | Admitting: Gastroenterology

## 2015-06-10 ENCOUNTER — Encounter: Payer: BLUE CROSS/BLUE SHIELD | Admitting: Urgent Care

## 2015-06-10 ENCOUNTER — Ambulatory Visit (INDEPENDENT_AMBULATORY_CARE_PROVIDER_SITE_OTHER): Payer: BLUE CROSS/BLUE SHIELD | Admitting: Urgent Care

## 2015-06-10 VITALS — BP 140/86 | HR 80 | Temp 97.8°F | Resp 17 | Ht 67.0 in | Wt 223.0 lb

## 2015-06-10 DIAGNOSIS — E785 Hyperlipidemia, unspecified: Secondary | ICD-10-CM | POA: Diagnosis not present

## 2015-06-10 DIAGNOSIS — Z0000 Encounter for general adult medical examination without abnormal findings: Secondary | ICD-10-CM

## 2015-06-10 DIAGNOSIS — E669 Obesity, unspecified: Secondary | ICD-10-CM | POA: Diagnosis not present

## 2015-06-10 DIAGNOSIS — R7303 Prediabetes: Secondary | ICD-10-CM

## 2015-06-10 DIAGNOSIS — M79604 Pain in right leg: Secondary | ICD-10-CM | POA: Diagnosis not present

## 2015-06-10 DIAGNOSIS — Z23 Encounter for immunization: Secondary | ICD-10-CM

## 2015-06-10 DIAGNOSIS — K648 Other hemorrhoids: Secondary | ICD-10-CM

## 2015-06-10 DIAGNOSIS — K6289 Other specified diseases of anus and rectum: Secondary | ICD-10-CM

## 2015-06-10 DIAGNOSIS — M79601 Pain in right arm: Secondary | ICD-10-CM

## 2015-06-10 DIAGNOSIS — I10 Essential (primary) hypertension: Secondary | ICD-10-CM | POA: Diagnosis not present

## 2015-06-10 LAB — COMPREHENSIVE METABOLIC PANEL
ALK PHOS: 81 U/L (ref 40–115)
ALT: 22 U/L (ref 9–46)
AST: 20 U/L (ref 10–35)
Albumin: 3.9 g/dL (ref 3.6–5.1)
BUN: 13 mg/dL (ref 7–25)
CALCIUM: 9.1 mg/dL (ref 8.6–10.3)
CO2: 24 mmol/L (ref 20–31)
Chloride: 106 mmol/L (ref 98–110)
Creat: 1.07 mg/dL (ref 0.70–1.33)
GLUCOSE: 87 mg/dL (ref 65–99)
POTASSIUM: 4.2 mmol/L (ref 3.5–5.3)
Sodium: 139 mmol/L (ref 135–146)
Total Bilirubin: 0.3 mg/dL (ref 0.2–1.2)
Total Protein: 6.9 g/dL (ref 6.1–8.1)

## 2015-06-10 LAB — POCT CBC
GRANULOCYTE PERCENT: 48.7 % (ref 37–80)
HCT, POC: 48.2 % (ref 43.5–53.7)
HEMOGLOBIN: 15.8 g/dL (ref 14.1–18.1)
Lymph, poc: 3.2 (ref 0.6–3.4)
MCH: 27.1 pg (ref 27–31.2)
MCHC: 32.9 g/dL (ref 31.8–35.4)
MCV: 82.3 fL (ref 80–97)
MID (cbc): 0.3 (ref 0–0.9)
MPV: 8.1 fL (ref 0–99.8)
PLATELET COUNT, POC: 251 10*3/uL (ref 142–424)
POC Granulocyte: 3.3 (ref 2–6.9)
POC LYMPH PERCENT: 47.4 %L (ref 10–50)
POC MID %: 3.9 % (ref 0–12)
RBC: 5.85 M/uL (ref 4.69–6.13)
RDW, POC: 16.6 %
WBC: 6.8 10*3/uL (ref 4.6–10.2)

## 2015-06-10 LAB — LIPID PANEL
Cholesterol: 229 mg/dL — ABNORMAL HIGH (ref 125–200)
HDL: 33 mg/dL — AB (ref >=40)
LDL CALC: 130 mg/dL — AB (ref <130)
TRIGLYCERIDES: 328 mg/dL — AB (ref <150)
Total CHOL/HDL Ratio: 6.9 Ratio — ABNORMAL HIGH (ref <=5.0)
VLDL: 66 mg/dL — AB (ref <30)

## 2015-06-10 LAB — POCT GLYCOSYLATED HEMOGLOBIN (HGB A1C): HEMOGLOBIN A1C: 6.4

## 2015-06-10 LAB — TSH: TSH: 2.45 mIU/L (ref 0.40–4.50)

## 2015-06-10 MED ORDER — METFORMIN HCL 500 MG PO TABS: 500.0000 mg | ORAL_TABLET | Freq: Every day | ORAL | Status: DC

## 2015-06-10 NOTE — Patient Instructions (Signed)
Keeping you healthy  Get these tests  Blood pressure- Have your blood pressure checked once a year by your healthcare provider.  Normal blood pressure is 120/80  Weight- Have your body mass index (BMI) calculated to screen for obesity.  BMI is a measure of body fat based on height and weight. You can also calculate your own BMI at ViewBanking.si.  Cholesterol- Have your cholesterol checked every year.  Diabetes- Have your blood sugar checked regularly if you have high blood pressure, high cholesterol, have a family history of diabetes or if you are overweight.  Screening for Colon Cancer- Colonoscopy starting at age 5.  Screening may begin sooner depending on your family history and other health conditions. Follow up colonoscopy as directed by your Gastroenterologist.  Screening for Prostate Cancer- Both blood work (PSA) and a rectal exam help screen for Prostate Cancer.  Screening begins at age 5 with African-American men and at age 56 with Caucasian men.  Screening may begin sooner depending on your family history.  Take these medicines  Aspirin- One aspirin daily can help prevent Heart disease and Stroke.  Flu shot- Every fall.  Tetanus- Every 10 years.  Zostavax- Once after the age of 26 to prevent Shingles.  Pneumonia shot- Once after the age of 66; if you are younger than 1, ask your healthcare provider if you need a Pneumonia shot.  Take these steps  Don't smoke- If you do smoke, talk to your doctor about quitting.  For tips on how to quit, go to www.smokefree.gov or call 1-800-QUIT-NOW.  Be physically active- Exercise 5 days a week for at least 30 minutes.  If you are not already physically active start slow and gradually work up to 30 minutes of moderate physical activity.  Examples of moderate activity include walking briskly, mowing the yard, dancing, swimming, bicycling, etc.  Eat a healthy diet- Eat a variety of healthy food such as fruits, vegetables, low  fat milk, low fat cheese, yogurt, lean meant, poultry, fish, beans, tofu, etc. For more information go to www.thenutritionsource.org  Drink alcohol in moderation- Limit alcohol intake to less than two drinks a day. Never drink and drive.  Dentist- Brush and floss twice daily; visit your dentist twice a year.  Depression- Your emotional health is as important as your physical health. If you're feeling down, or losing interest in things you would normally enjoy please talk to your healthcare provider.  Eye exam- Visit your eye doctor every year.  Safe sex- If you may be exposed to a sexually transmitted infection, use a condom.  Seat belts- Seat belts can save your life; always wear one.  Smoke/Carbon Monoxide detectors- These detectors need to be installed on the appropriate level of your home.  Replace batteries at least once a year.  Skin cancer- When out in the sun, cover up and use sunscreen 15 SPF or higher.  Violence- If anyone is threatening you, please tell your healthcare provider.  Living Will/ Health care power of attorney- Speak with your healthcare provider and family.   Prediabetes Eating Plan Prediabetes--also called impaired glucose tolerance or impaired fasting glucose--is a condition that causes blood sugar (blood glucose) levels to be higher than normal. Following a healthy diet can help to keep prediabetes under control. It can also help to lower the risk of type 2 diabetes and heart disease, which are increased in people who have prediabetes. Along with regular exercise, a healthy diet:  Promotes weight loss.  Helps to control blood sugar  levels.  Helps to improve the way that the body uses insulin. WHAT DO I NEED TO KNOW ABOUT THIS EATING PLAN?  Use the glycemic index (GI) to plan your meals. The index tells you how quickly a food will raise your blood sugar. Choose low-GI foods. These foods take a longer time to raise blood sugar.  Pay close attention to the  amount of carbohydrates in the food that you eat. Carbohydrates increase blood sugar levels.  Keep track of how many calories you take in. Eating the right amount of calories will help you to achieve a healthy weight. Losing about 7 percent of your starting weight can help to prevent type 2 diabetes.  You may want to follow a Mediterranean diet. This diet includes a lot of vegetables, lean meats or fish, whole grains, fruits, and healthy oils and fats. WHAT FOODS CAN I EAT? Grains Whole grains, such as whole-wheat or whole-grain breads, crackers, cereals, and pasta. Unsweetened oatmeal. Bulgur. Barley. Quinoa. Brown rice. Corn or whole-wheat flour tortillas or taco shells. Vegetables Lettuce. Spinach. Peas. Beets. Cauliflower. Cabbage. Broccoli. Carrots. Tomatoes. Squash. Eggplant. Herbs. Peppers. Onions. Cucumbers. Brussels sprouts. Fruits Berries. Bananas. Apples. Oranges. Grapes. Papaya. Mango. Pomegranate. Kiwi. Grapefruit. Cherries. Meats and Other Protein Sources Seafood. Lean meats, such as chicken and [place] or lean cuts of pork and beef. Tofu. Eggs. Nuts. Beans. Dairy Low-fat or fat-free dairy products, such as yogurt, cottage cheese, and cheese. Beverages Water. Tea. Coffee. Sugar-free or diet soda. Seltzer water. Milk. Milk alternatives, such as soy or almond milk. Condiments Mustard. Relish. Low-fat, low-sugar ketchup. Low-fat, low-sugar barbecue sauce. Low-fat or fat-free mayonnaise. Sweets and Desserts Sugar-free or low-fat pudding. Sugar-free or low-fat ice cream and other frozen treats. Fats and Oils Avocado. Walnuts. Olive oil. The items listed above may not be a complete list of recommended foods or beverages. Contact your dietitian for more options.  WHAT FOODS ARE NOT RECOMMENDED? Grains Refined white flour and flour products, such as bread, pasta, snack foods, and cereals. Beverages Sweetened drinks, such as sweet iced tea and soda. Sweets and Desserts Baked goods,  such as cake, cupcakes, pastries, cookies, and cheesecake. The items listed above may not be a complete list of foods and beverages to avoid. Contact your dietitian for more information.   This information is not intended to replace advice given to you by your health care provider. Make sure you discuss any questions you have with your health care provider.   Document Released: 09/02/2014 Document Reviewed: 09/02/2014 Elsevier Interactive Patient Education 2016 [employer].   Hemorrhoids Hemorrhoids are swollen veins around the rectum or anus. There are two types of hemorrhoids:   Internal hemorrhoids. These occur in the veins just inside the rectum. They may poke through to the outside and become irritated and painful.  External hemorrhoids. These occur in the veins outside the anus and can be felt as a painful swelling or hard lump near the anus. CAUSES  Pregnancy.   Obesity.   Constipation or diarrhea.   Straining to have a bowel movement.   Sitting for long periods on the toilet.  Heavy lifting or other activity that caused you to strain.  Anal intercourse. SYMPTOMS   Pain.   Anal itching or irritation.   Rectal bleeding.   Fecal leakage.   Anal swelling.   One or more lumps around the anus.  DIAGNOSIS  Your caregiver may be able to diagnose hemorrhoids by visual examination. Other examinations or tests that may be performed include:  Examination of the rectal area with a gloved hand (digital rectal exam).   Examination of anal canal using a small tube (scope).   A blood test if you have lost a significant amount of blood.  A test to look inside the colon (sigmoidoscopy or colonoscopy). TREATMENT Most hemorrhoids can be treated at home. However, if symptoms do not seem to be getting better or if you have a lot of rectal bleeding, your caregiver may perform a procedure to help make the hemorrhoids get smaller or remove them completely. Possible  treatments include:   Placing a rubber band at the base of the hemorrhoid to cut off the circulation (rubber band ligation).   Injecting a chemical to shrink the hemorrhoid (sclerotherapy).   Using a tool to burn the hemorrhoid (infrared light therapy).   Surgically removing the hemorrhoid (hemorrhoidectomy).   Stapling the hemorrhoid to block blood flow to the tissue (hemorrhoid stapling).  HOME CARE INSTRUCTIONS   Eat foods with fiber, such as whole grains, beans, nuts, fruits, and vegetables. Ask your doctor about taking products with added fiber in them (fibersupplements).  Increase fluid intake. Drink enough water and fluids to keep your urine clear or pale yellow.   Exercise regularly.   Go to the bathroom when you have the urge to have a bowel movement. Do not wait.   Avoid straining to have bowel movements.   Keep the anal area dry and clean. Use wet toilet paper or moist towelettes after a bowel movement.   Medicated creams and suppositories may be used or applied as directed.   Only take over-the-counter or prescription medicines as directed by your caregiver.   Take warm sitz baths for 15-20 minutes, 3-4 times a day to ease pain and discomfort.   Place ice packs on the hemorrhoids if they are tender and swollen. Using ice packs between sitz baths may be helpful.   Put ice in a plastic bag.   Place a towel between your skin and the bag.   Leave the ice on for 15-20 minutes, 3-4 times a day.   Do not use a donut-shaped pillow or sit on the toilet for long periods. This increases blood pooling and pain.  SEEK MEDICAL CARE IF:  You have increasing pain and swelling that is not controlled by treatment or medicine.  You have uncontrolled bleeding.  You have difficulty or you are unable to have a bowel movement.  You have pain or inflammation outside the area of the hemorrhoids. MAKE SURE YOU:  Understand these instructions.  Will watch your  condition.  Will get help right away if you are not doing well or get worse.   This information is not intended to replace advice given to you by your health care provider. Make sure you discuss any questions you have with your health care provider.   Document Released: 04/15/2000 Document Revised: 04/04/2012 Document Reviewed: 02/21/2012 Elsevier Interactive Patient Education [employer].

## 2015-06-10 NOTE — Progress Notes (Signed)
MRN: [id number]  Subjective:   [name] is a 60 y.o. male presenting for annual physical exam.  Medical care team includes: PCP: None. Vision: Does not get regular eye care. Dental: Patient gets dental care regularly.  Specialists: GI - last colonoscopy was in 2009, was normal and is on 10 year follow up.    Patient is currently single, works as Holiday representative. Eats unhealthily but stopped drinking sodas. Does not exercise.   Peri-rectal pain - reports ano-rectal pain. Has a history of peri-rectal abscess ~1 year ago resolved with antibiotics, Augmentin. Admits difficulty with constipation, strains with bowel movements but denies bloody stools, drainage of pus or bleeding, fevers, masses.  Leg cramps - reports ~1 year history of intermittent right leg cramps. Typically occurs in his thigh and radiates down to his calf. Admits that it lasts the majority of the day. Resting and propping his leg up relieves the leg pain. Alleve used to help but no longer provides relief. Patient drinks ~1L of water per day, is active with his work but cannot identify any specific triggers. Denies numbness and tingling, redness, leg swelling.  Alex Hunter has HYPERLIPIDEMIA; Unspecified essential hypertension; and Perirectal abscess on his problem list.  Alex Hunter currently has no medications in their medication list. He has No Known Allergies.  Alex Hunter  has no past medical history on file. Also  has no past surgical history on file.  His family history includes Hyperlipidemia in his mother. Has a brother that passed from leukemia, he was estranged. He has another brother who passed away from heart attack at age 41, also had substance abuse.  Immunizations: needs TDAP updated today.  Review of Systems  Constitutional: Negative for fever, chills, weight loss, malaise/fatigue and diaphoresis.  HENT: Negative for congestion, ear discharge, ear pain, hearing loss, nosebleeds,  sore throat and tinnitus.   Eyes: Negative for blurred vision, double vision, photophobia, pain, discharge and redness.  Respiratory: Negative for cough, shortness of breath and wheezing.   Cardiovascular: Negative for chest pain, palpitations and leg swelling.  Gastrointestinal: Negative for nausea, vomiting, abdominal pain, diarrhea, constipation and blood in stool.  Genitourinary: Negative for dysuria, urgency, frequency, hematuria and flank pain.       Ano-rectal pain as in HPI.  Musculoskeletal: Negative for myalgias, back pain and joint pain.       Right leg pain as in HPI.  Skin: Negative for itching and rash.  Neurological: Negative for dizziness, tingling, seizures, loss of consciousness, weakness and headaches.  Endo/Heme/Allergies: Negative for polydipsia.  Psychiatric/Behavioral: Negative for depression, suicidal ideas, hallucinations, memory loss and substance abuse. The patient has insomnia (as in HPI). The patient is not nervous/anxious.    Objective:   Vitals: BP 140/86 mmHg  Pulse 80  Temp(Src) 97.8 F (36.6 C) (Oral)  Resp 17  Ht 5\' 7"  (1.702 m)  Wt 223 lb (101.152 kg)  BMI 34.92 kg/m2  SpO2 97%  Physical Exam  Constitutional: He is oriented to person, place, and time. He appears well-developed and well-nourished.  HENT:  TM's intact bilaterally, no effusions or erythema. Nasal turbinates pink and moist, nasal passages patent. No sinus tenderness. Oropharynx clear, mucous membranes moist, dentition in good repair.  Eyes: Conjunctivae and EOM are normal. Pupils are equal, round, and reactive to light. Right eye exhibits no discharge. Left eye exhibits no discharge. No scleral icterus.  Neck: Normal range of motion. Neck supple. No thyromegaly present.  Cardiovascular: Normal rate, regular rhythm  and intact distal pulses.  Exam reveals no gallop and no friction rub.   No murmur heard. Pulmonary/Chest: No stridor. No respiratory distress. He has no wheezes. He has  no rales.  Abdominal: Soft. Bowel sounds are normal. He exhibits no distension and no mass. There is no tenderness.  Genitourinary: Rectal exam shows internal hemorrhoid. Rectal exam shows no external hemorrhoid, no fissure, no mass, no tenderness and anal tone normal. Prostate is not enlarged and not tender.  Musculoskeletal: Normal range of motion. He exhibits no edema or tenderness.  Lymphadenopathy:    He has no cervical adenopathy.  Neurological: He is alert and oriented to person, place, and time.  Skin: Skin is warm and dry. No rash noted. No erythema. No pallor.  Psychiatric: He has a normal mood and affect.   Results for orders placed or performed in visit on 06/10/15 (from the past 24 hour(s))  POCT CBC     Status: None   Collection Time: 06/10/15 11:33 AM  Result Value Ref Range   WBC 6.8 4.6 - 10.2 K/uL   Lymph, poc 3.2 0.6 - 3.4   POC LYMPH PERCENT 47.4 10 - 50 %L   MID (cbc) 0.3 0 - 0.9   POC MID % 3.9 0 - 12 %M   POC Granulocyte 3.3 2 - 6.9   Granulocyte percent 48.7 37 - 80 %G   RBC 5.85 4.69 - 6.13 M/uL   Hemoglobin 15.8 14.1 - 18.1 g/dL   HCT, POC 48.2 43.5 - 53.7 %   MCV 82.3 80 - 97 fL   MCH, POC 27.1 27 - 31.2 pg   MCHC 32.9 31.8 - 35.4 g/dL   RDW, POC 16.6 %   Platelet Count, POC 251 142 - 424 K/uL   MPV 8.1 0 - 99.8 fL  POCT glycosylated hemoglobin (Hb A1C)     Status: None   Collection Time: 06/10/15 11:36 AM  Result Value Ref Range   Hemoglobin A1C 6.4    Assessment and Plan :   1. Annual physical exam - Labs pending, patient is medically stable. - Discussed healthy lifestyle, diet, exercise, preventative care, vaccinations, and addressed patient's concerns.    2. Essential hypertension - Patient has been told he hast high blood pressure in the past, but has never been started on medications for this. Will make lifestyle changes as seen below, consider starting BP medication if his BP remains elevated in 3 months.  3. Hyperlipidemia - Labs  pending, consider medical therapy  4. Obesity 5. Pre-diabetes - Discussed significant dietary modifications, start exercise. Patient is very motivated and will also start Metformin 500mg  QD. RTC in 3 months for recheck.  6. Leg pain, diffuse, right - Unclear etiology, may be related to his level of activity and inadequate hydration. Labs pending, f/u thereafter.  7. Anal or rectal pain 8. Internal hemorrhoid - Will refer to General Surgery for consult.  9. Need for Tdap vaccination - Tdap vaccine greater than or equal to 7yo IM   Jaynee Eagles, PA-C [facility] [phone] 06/10/2015  11:00 AM

## 2015-06-10 NOTE — Progress Notes (Signed)
This encounter was created in error - please disregard.

## 2015-06-11 ENCOUNTER — Encounter: Payer: Self-pay | Admitting: Urgent Care

## 2015-06-11 LAB — SEDIMENTATION RATE: Sed Rate: 1 mm/hr (ref 0–20)

## 2015-06-15 ENCOUNTER — Telehealth: Payer: Self-pay | Admitting: *Deleted

## 2015-06-15 DIAGNOSIS — R252 Cramp and spasm: Secondary | ICD-10-CM

## 2015-06-15 NOTE — Telephone Encounter (Signed)
Pt called and requested lab work.   Lab results given.  Pt states that his legs are still cramping very badly.  What should he do?

## 2015-06-16 MED ORDER — CYCLOBENZAPRINE HCL 5 MG PO TABS: 5.0000 mg | ORAL_TABLET | Freq: Three times a day (TID) | ORAL | Status: DC | PRN

## 2015-06-16 NOTE — Telephone Encounter (Signed)
Patient still has cramping in his legs is still occuring. He states that he has increased his water intake but his cramps are happening while he's sitting or after work, feels like they "lock up on him". He will try Flexeril and I will send him over to get ABI.

## 2015-07-01 ENCOUNTER — Other Ambulatory Visit: Payer: Self-pay

## 2015-07-01 ENCOUNTER — Encounter (HOSPITAL_COMMUNITY): Payer: Self-pay

## 2015-07-01 DIAGNOSIS — M79601 Pain in right arm: Secondary | ICD-10-CM

## 2015-07-01 DIAGNOSIS — M79604 Pain in right leg: Secondary | ICD-10-CM

## 2015-07-02 ENCOUNTER — Encounter (HOSPITAL_COMMUNITY): Payer: Self-pay

## 2015-07-06 ENCOUNTER — Ambulatory Visit (HOSPITAL_COMMUNITY)
Admission: RE | Admit: 2015-07-06 | Discharge: 2015-07-06 | Disposition: A | Discharge status: Home / Self Care | Payer: BLUE CROSS/BLUE SHIELD | Source: Ambulatory Visit | Attending: Urgent Care | Admitting: Urgent Care

## 2015-07-06 DIAGNOSIS — M79604 Pain in right leg: Secondary | ICD-10-CM

## 2015-07-07 ENCOUNTER — Telehealth: Payer: Self-pay | Admitting: *Deleted

## 2015-07-07 NOTE — Telephone Encounter (Signed)
Pt called and wanted to know if his results can be viewed and shared with him.  Please call [phone] (H)

## 2015-07-07 NOTE — Telephone Encounter (Signed)
Pt called and wanted to know his result of his vascular ultrasound.  It was done yesterday.  Advised him that we did not have the full report and will call him when we review it.

## 2015-07-08 ENCOUNTER — Ambulatory Visit (INDEPENDENT_AMBULATORY_CARE_PROVIDER_SITE_OTHER): Payer: BLUE CROSS/BLUE SHIELD | Admitting: Urgent Care

## 2015-07-08 ENCOUNTER — Ambulatory Visit (INDEPENDENT_AMBULATORY_CARE_PROVIDER_SITE_OTHER): Payer: BLUE CROSS/BLUE SHIELD

## 2015-07-08 VITALS — BP 130/96 | HR 93 | Temp 97.8°F | Resp 18 | Ht 65.75 in | Wt 217.0 lb

## 2015-07-08 DIAGNOSIS — M545 Low back pain: Secondary | ICD-10-CM

## 2015-07-08 DIAGNOSIS — M79651 Pain in right thigh: Secondary | ICD-10-CM

## 2015-07-08 DIAGNOSIS — M79604 Pain in right leg: Secondary | ICD-10-CM

## 2015-07-08 MED ORDER — MELOXICAM 15 MG PO TABS: 7.5000 mg | ORAL_TABLET | Freq: Every day | ORAL | Status: DC

## 2015-07-08 NOTE — Progress Notes (Signed)
    MRN: [id number] DOB: Jun 26, 1955  Subjective:   Alex Hunter is a 60 y.o. male presenting for Follow-up  Patient was initially seen on 06/10/2015 for an annual exam. At that time, patient reported ~1 year history of daily leg cramps. Cramps are like a tightness in nature, feels like his entire right leg locks. Problem start at right hip and radiates down to calf. He has since tried Flexeril, increased his water intake, electrolytes. He has had a negative ABI study, negative ESR. Labs have generally been unremarkable.   Alex Hunter has a current medication list which includes the following prescription(s): cyclobenzaprine and metformin. Also has No Known Allergies.  Alex Hunter  has no past medical history on file. Also  has no past surgical history on file.  Objective:   Vitals: BP 130/96 mmHg  Pulse 93  Temp(Src) 97.8 F (36.6 C) (Oral)  Resp 18  Ht 5' 5.75" (1.67 m)  Wt 217 lb (98.431 kg)  BMI 35.29 kg/m2  SpO2 98%  Physical Exam  Constitutional: He is oriented to person, place, and time. He appears well-developed and well-nourished.  Cardiovascular: Normal rate.   Pulmonary/Chest: Effort normal.  Musculoskeletal:       Right hip: He exhibits decreased range of motion (full extension and flexion) and tenderness (over areas depicted). He exhibits normal strength, no bony tenderness, no swelling, no crepitus, no deformity and no laceration.       Legs: Neurological: He is alert and oriented to person, place, and time.  Skin: Skin is warm and dry.   No results found for this or any previous visit (from the past 24 hour(s)).   Dg Lumbar Spine Complete  07/08/2015  CLINICAL DATA:  Low back pain radiating to right leg. No known injury. EXAM: LUMBAR SPINE - COMPLETE 4+ VIEW COMPARISON:  None. FINDINGS: There is no evidence of lumbar spine fracture. Alignment is normal. Intervertebral disc spaces are maintained. No focal bone lesions identified. IMPRESSION: Negative lumbar spine  radiographs. Electronically Signed   By: Earle Gell M.D.   On: 07/08/2015 19:36   Assessment and Plan :   1. Low back pain radiating to right leg 2. Right thigh pain - Discussed differential including piriformis syndrome. Will start conservative management for this including rehab, meloxicam. RTC in 2-4 weeks for recheck.   Jaynee Eagles, PA-C [facility] [phone] 07/08/2015 6:10 PM

## 2015-07-08 NOTE — Patient Instructions (Addendum)
Because you received an x-ray today, you will receive an invoice from [facility]. Please contact [facility] at [phone] with questions or concerns regarding your invoice. Our billing staff will not be able to assist you with those questions.   For stretches, perform for 10 seconds for 5 sets. Also, perform exercises of 10 repetitions for 5 sets.  Piriformis Syndrome With Rehab Piriformis syndrome is a condition the affects the nervous system in the area of the hip, and is characterized by pain and possibly a loss of feeling in the backside (posterior) thigh that may extend down the entire length of the leg. The symptoms are caused by an increase in pressure on the sciatic nerve by the piriformis muscle, which is on the back of the hip and is responsible for externally rotating the hip. The sciatic nerve and its branches connect to much of the leg. Normally the sciatic nerve runs between the piriformis muscle and other muscles. However, in certain individuals the nerve runs through the muscle, which causes an increase in pressure on the nerve and results in the symptoms of piriformis syndrome. SYMPTOMS   Pain, tingling, numbness, or burning in the back of the thigh that may also extend down the entire leg.  Occasionally, tenderness in the buttock.  Loss of function of the leg.  Pain that worsens when using the piriformis muscle (running, jumping, or stairs).  Pain that increases with prolonged sitting.  Pain that is lessened by lying flat on the back. CAUSES   Piriformis syndrome is the result of an increase in pressure placed on the sciatic nerve. Oftentimes, piriformis syndrome is an overuse injury.  Stress placed on the nerve from a sudden increase in the intensity, frequency, or duration of training.  Compensation of other extremity injuries. RISK INCREASES WITH:  Sports that involve the piriformis muscle (running, walking, or jumping).  You are born with  (congenital) a defect in which the sciatic nerve passes through the muscle. PREVENTION  Warm up and stretch properly before activity.  Allow for adequate recovery between workouts.  Maintain physical fitness:  Strength, flexibility, and endurance.  Cardiovascular fitness. PROGNOSIS  If treated properly, the symptoms of piriformis syndrome usually resolve in 2 to 6 weeks. RELATED COMPLICATIONS   Persistent and possibly permanent pain and numbness in the lower extremity.  Weakness of the extremity that may progress to disability and inability to compete. TREATMENT  The most effective treatment for piriformis syndrome is rest from any activities that aggravate the symptoms. Ice and pain medication may help reduce pain and inflammation. The use of strengthening and stretching exercises may help reduce pain with activity. These exercises may be performed at home or with a therapist. A referral to a therapist may be given for further evaluation and treatment, such as ultrasound. Corticosteroid injections may be given to reduce inflammation that is causing pressure to be placed on the sciatic nerve. If nonsurgical (conservative) treatment is unsuccessful, then surgery may be recommended.  MEDICATION   If pain medication is necessary, then nonsteroidal anti-inflammatory medications, such as aspirin and ibuprofen, or other minor pain relievers, such as acetaminophen, are often recommended.  Do not take pain medication for 7 days before surgery.  Prescription pain relievers may be given if deemed necessary by your caregiver. Use only as directed and only as much as you need.  Corticosteroid injections may be given by your caregiver. These injections should be reserved for the most serious cases, because they may only be given a  certain number of times. HEAT AND COLD:   Cold treatment (icing) relieves pain and reduces inflammation. Cold treatment should be applied for 10 to 15 minutes every 2 to  3 hours for inflammation and pain and immediately after any activity that aggravates your symptoms. Use ice packs or massage the area with a piece of ice (ice massage).  Heat treatment may be used prior to performing the stretching and strengthening activities prescribed by your caregiver, physical therapist, or athletic trainer. Use a heat pack or soak the injury in warm water. SEEK IMMEDIATE MEDICAL CARE IF:  Treatment seems to offer no benefit, or the condition worsens.  Any medications produce adverse side effects. EXERCISES RANGE OF MOTION (ROM) AND STRETCHING EXERCISES - Piriformis Syndrome These exercises may help you when beginning to rehabilitate your injury. Your symptoms may resolve with or without further involvement from your physician, physical therapist, or athletic trainer. While completing these exercises, remember:   Restoring tissue flexibility helps normal motion to return to the joints. This allows healthier, less painful movement and activity.  An effective stretch should be held for at least 30 seconds.  A stretch should never be painful. You should only feel a gentle lengthening or release in the stretched tissue. STRETCH - Hip Rotators  Lie on your back on a firm surface. Grasp your right / left knee with your right / left hand and your ankle with your opposite hand.  Keeping your hips and shoulders firmly planted, gently pull your right / left knee and rotate your lower leg toward your opposite shoulder until you feel a stretch in your buttocks.  Hold this stretch for __________ seconds. Repeat this stretch __________ times. Complete this stretch __________ times per day. STRETCH - Iliotibial Band  On the floor or bed, lie on your side so your right / left leg is on top. Bend your knee and grab your ankle.  Slowly bring your knee back so that your thigh is in line with your trunk. Keep your heel at your buttocks and gently arch your back so your head, shoulders,  and hips line up.  Slowly lower your leg so that your knee approaches the floor/bed until you feel a gentle stretch on the outside of your right / left thigh. If you do not feel a stretch and your knee will not fall farther, place the heel of your opposite foot on top of your knee and pull your thigh down farther.  Hold this stretch for __________ seconds. Repeat __________ times. Complete __________ times per day. STRENGTHENING EXERCISES - Piriformis Syndrome  These are some of the caregiver again or until your symptoms are resolved. Remember:   Strong muscles with good endurance tolerate stress better.  Do the exercises as initially prescribed by your caregiver. Progress slowly with each exercise, gradually increasing the number of repetitions and weight used under their guidance. STRENGTH - Hip Abductors, Straight Leg Raises Be aware of your form throughout the entire exercise so that you exercise the correct muscles. Sloppy form means that you are not strengthening the correct muscles.  Lie on your side so that your head, shoulders, knee, and hip line up. You may bend your lower knee to help maintain your balance. Your right / left leg should be on top.  Roll your hips slightly forward, so that your hips are stacked directly over each other and your right / left knee is facing forward.  Lift your top leg up 4-6 inches, leading with your heel.  Be sure that your foot does not drift forward or that your knee does not roll toward the ceiling.  Hold this position for __________ seconds. You should feel the muscles in your outer hip lifting (you may not notice this until your leg begins to tire).  Slowly lower your leg to the starting position. Allow the muscles to fully relax before beginning the next repetition. Repeat __________ times. Complete this exercise __________ times per day.  STRENGTH - Hip Abductors, Quadruped  On a firm, lightly padded surface, position yourself on your hands  and knees. Your hands should be directly below your shoulders and your knees should be directly below your hips.  Keeping your right / left knee bent, lift your leg out to the side. Keep your legs level and in line with your shoulders.  Position yourself on your hands and knees.  Hold for __________ seconds.  Keeping your trunk steady and your hips level, slowly lower your leg to the starting position. Repeat __________ times. Complete this exercise __________ times per day.  STRENGTH - Hip Abductors, Standing  Tie one end of a rubber exercise band/tubing to a secure surface (table, pole) and tie a loop at the other end.  Place the loop around your right / left ankle. Keeping your ankle with the band directly opposite of the secured end, step away until there is tension in the tube/band.  Hold onto a chair as needed for balance.  Keeping your back upright, your shoulders over your hips, and your toes pointing forward, lift your right / left leg out to your side. Be sure to lift your leg with your hip muscles. Do not "throw" your leg or tip your body to lift your leg.  Slowly and with control, return to the starting position. Repeat exercise __________ times. Complete this exercise __________ times per day.    This information is not intended to replace advice given to you by your health care provider. Make sure you discuss any questions you have with your health care provider.   Document Released: 04/18/2005 Document Revised: 09/02/2014 Document Reviewed: 07/31/2008 Elsevier Interactive Patient Education [employer].

## 2015-07-08 NOTE — Telephone Encounter (Signed)
Left VM for patient. Reported normal vascular US results. I recommended patient rtc for complete re-evaluation if his leg pain persists.

## 2015-07-28 ENCOUNTER — Telehealth: Payer: Self-pay

## 2015-07-28 NOTE — Telephone Encounter (Signed)
.   Low back pain radiating to right leg 2. Right thigh pain - Discussed differential including piriformis syndrome. Will start conservative management for this including rehab, meloxicam. RTC in 2-4 weeks for recheck.  RTC, pt VM not set up.

## 2015-07-28 NOTE — Telephone Encounter (Signed)
Patient was seen for his leg and is still having pain in his thighs. Patient states that the medication is not doing anything. Please advise! [phone]

## 2015-07-30 NOTE — Telephone Encounter (Signed)
Advised pt to RTC. Pt understood. 

## 2015-08-30 ENCOUNTER — Ambulatory Visit (INDEPENDENT_AMBULATORY_CARE_PROVIDER_SITE_OTHER): Payer: BLUE CROSS/BLUE SHIELD | Admitting: Urgent Care

## 2015-08-30 ENCOUNTER — Ambulatory Visit (INDEPENDENT_AMBULATORY_CARE_PROVIDER_SITE_OTHER): Payer: BLUE CROSS/BLUE SHIELD

## 2015-08-30 VITALS — BP 134/82 | HR 96 | Temp 97.8°F | Resp 12 | Ht 65.0 in | Wt 218.0 lb

## 2015-08-30 DIAGNOSIS — R252 Cramp and spasm: Secondary | ICD-10-CM

## 2015-08-30 DIAGNOSIS — Z113 Encounter for screening for infections with a predominantly sexual mode of transmission: Secondary | ICD-10-CM | POA: Diagnosis not present

## 2015-08-30 LAB — CK: CK TOTAL: 196 U/L (ref 7–232)

## 2015-08-30 LAB — HIV ANTIBODY (ROUTINE TESTING W REFLEX): HIV 1&2 Ab, 4th Generation: NONREACTIVE

## 2015-08-30 LAB — MAGNESIUM: MAGNESIUM: 2.1 mg/dL (ref 1.5–2.5)

## 2015-08-30 MED ORDER — CYCLOBENZAPRINE HCL ER 15 MG PO CP24: 15.0000 mg | ORAL_CAPSULE | Freq: Every day | ORAL | Status: DC | PRN

## 2015-08-30 NOTE — Patient Instructions (Addendum)
Please start an over-the-counter magnesium supplement.  Muscle Cramps and Spasms Muscle cramps and spasms occur when a muscle or muscles tighten and you have no control over this tightening (involuntary muscle contraction). They are a common problem and can develop in any muscle. The most common place is in the calf muscles of the leg. Both muscle cramps and muscle spasms are involuntary muscle contractions, but they also have differences:   Muscle cramps are sporadic and painful. They may last a few seconds to a quarter of an hour. Muscle cramps are often more forceful and last longer than muscle spasms.  Muscle spasms may or may not be painful. They may also last just a few seconds or much longer. CAUSES  It is uncommon for cramps or spasms to be due to a serious underlying problem. In many cases, the cause of cramps or spasms is unknown. Some common causes are:   Overexertion.   Overuse from repetitive motions (doing the same thing over and over).   Remaining in a certain position for a long period of time.   Improper preparation, form, or technique while performing a sport or activity.   Dehydration.   Injury.   Side effects of some medicines.   Abnormally low levels of the salts and ions in your blood (electrolytes), especially potassium and calcium. This could happen if you are taking water pills (diuretics) or you are pregnant.  Some underlying medical problems can make it more likely to develop cramps or spasms. These include, but are not limited to:   Diabetes.   Parkinson disease.   Hormone disorders, such as thyroid problems.   Alcohol abuse.   Diseases specific to muscles, joints, and bones.   Blood vessel disease where not enough blood is getting to the muscles.  HOME CARE INSTRUCTIONS   Stay well hydrated. Drink enough water and fluids to keep your urine clear or pale yellow.  It may be helpful to massage, stretch, and relax the affected  muscle.  For tight or tense muscles, use a warm towel, heating pad, or hot shower water directed to the affected area.  If you are sore or have pain after a cramp or spasm, applying ice to the affected area may relieve discomfort.  Put ice in a plastic bag.  Place a towel between your skin and the bag.  Leave the ice on for 15-20 minutes, 03-04 times a day.  Medicines used to treat a known cause of cramps or spasms may help reduce their frequency or severity. Only take over-the-counter or prescription medicines as directed by your caregiver. SEEK MEDICAL CARE IF:  Your cramps or spasms get more severe, more frequent, or do not improve over time.  MAKE SURE YOU:   Understand these instructions.  Will watch your condition.  Will get help right away if you are not doing well or get worse.   This information is not intended to replace advice given to you by your health care provider. Make sure you discuss any questions you have with your health care provider.   Document Released: 10/08/2001 Document Revised: 08/13/2012 Document Reviewed: 04/04/2012 Elsevier Interactive Patient Education 2016 [employer].     IF you received an x-ray today, you will receive an invoice from [facility]. Please contact [facility] at [phone] with questions or concerns regarding your invoice.   IF you received labwork today, you will receive an invoice from [employer]. Please contact [facility] at [phone] with questions or concerns regarding  your invoice.   Our billing staff will not be able to assist you with questions regarding bills from these companies.  You will be contacted with the lab results as soon as they are available. The fastest way to get your results is to activate your My Chart account. Instructions are located on the last page of this paperwork. If you have not heard from [place] regarding the results in 2 weeks, please contact this  office.

## 2015-08-30 NOTE — Progress Notes (Signed)
    MRN: [id number] DOB: [birth year]  Subjective:   Alex Hunter is a 60 y.o. male presenting for chief complaint of Leg Pain  Leg Cramps - Patient is presenting for follow up on 8 month history of persistent right leg cramping, spasms. Patient has tried Anaprox, meloxicam, Flexeril without any relief. He has hydrated much better, has eaten potassium rich foods, stretched out, switched his wallet form his back to his front pocket. He continues to work and be very active. 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 This does make his cramping worse but he also has it on days that he does not work. He feels his leg lock, has difficulty ambulating before it eventually loosens up for him. His lab work has been negative as well as lumbar x-rays.  STI testing - patient also requests STI testing given that his now ex-girlfriend cheated on him. He would like to make sure he gets screened.  Child has a current medication list which includes the following prescription(s): cyclobenzaprine, metformin, and meloxicam. Also has No Known Allergies.  Kerin  has no past medical history on file. Also  has no past surgical history on file.  Objective:   Vitals: BP 134/82 mmHg  Pulse 96  Temp(Src) 97.8 F (36.6 C) (Oral)  Resp 12  Ht 5\' 5"  (1.651 m)  Wt 218 lb (98.884 kg)  BMI 36.28 kg/m2  SpO2 98%  Physical Exam  Constitutional: He is oriented to person, place, and time. He appears well-developed and well-nourished.  Cardiovascular: Normal rate.   Pulmonary/Chest: Effort normal.  Musculoskeletal:       Right knee: He exhibits normal range of motion, no swelling, no effusion, no ecchymosis, no deformity, no laceration, no erythema, normal alignment, no LCL laxity, normal patellar mobility and no bony tenderness. No tenderness found.       Legs: Neurological: He is alert and oriented to person, place, and time. He  has normal reflexes.  Skin: Skin is warm and dry.   Dg Knee Complete 4 Views Right  08/30/2015  CLINICAL DATA:  60 year old male with history of left-sided knee pain. No known injury. EXAM: RIGHT KNEE - COMPLETE 4+ VIEW COMPARISON:  No priors. FINDINGS: Multiple views of the right knee demonstrate no acute displaced fracture, subluxation, dislocation, or soft tissue abnormality. IMPRESSION: No acute radiographic abnormality of the right knee. Electronically Signed   By: Vinnie Langton M.D.   On: 08/30/2015 13:58    Assessment and Plan :   1. Screen for STD (sexually transmitted disease) - Labs pending.  2. Cramps of right lower extremity - Unclear etiology, will have patient start magnesium supplement. Patient will also undergo trial of Amrix, coupon provided. Labs pending. Referral to Neuro for EMG testing.  Jaynee Eagles, PA-C [facility] [phone] 08/30/2015 12:43 PM

## 2015-08-31 LAB — GC/CHLAMYDIA PROBE AMP
CT PROBE, AMP APTIMA: NOT DETECTED
GC Probe RNA: NOT DETECTED

## 2015-09-01 ENCOUNTER — Telehealth: Payer: Self-pay | Admitting: *Deleted

## 2015-09-01 LAB — RPR

## 2015-09-01 NOTE — Telephone Encounter (Signed)
Pt wants his lab results from 08/30/15

## 2015-09-10 ENCOUNTER — Ambulatory Visit: Payer: BLUE CROSS/BLUE SHIELD | Admitting: Urgent Care

## 2015-09-24 ENCOUNTER — Telehealth: Payer: Self-pay

## 2015-09-24 NOTE — Telephone Encounter (Signed)
The coordinator at [facility] wants to know if the referring provider, Jaynee Eagles, want a consult in addition to the EMG study, or just the EMG study.  An in-basket message was sent on 09/03/15 but a response was never given.  Please advise, thank you.

## 2015-09-24 NOTE — Telephone Encounter (Signed)
We definitely need a consult! I'm sorry if I missed any in-basket message about this.

## 2015-10-03 ENCOUNTER — Telehealth: Payer: Self-pay

## 2015-10-03 NOTE — Telephone Encounter (Signed)
Patient states he cannot wait until the 29th for the nerve conduction study.  His legs hurt and he has to stay in bed.  What can you do to help now?   [phone] (H)

## 2015-10-07 MED ORDER — TRAMADOL HCL 50 MG PO TABS: 50.0000 mg | ORAL_TABLET | Freq: Three times a day (TID) | ORAL | Status: DC | PRN

## 2015-10-07 MED ORDER — GABAPENTIN 300 MG PO CAPS: ORAL_CAPSULE | ORAL | Status: DC

## 2015-10-07 NOTE — Telephone Encounter (Signed)
Patient is still having significant muscle cramps and has a difficult time walking, working. He has an appointment with Neuro on 10/29/2015 and is on a wait list for any cancellations. Patient would like to try Gabapentin and Tramadol. Potential for adverse effects were reviewed with patient. He will pick scripts up soon.

## 2015-10-29 ENCOUNTER — Telehealth: Payer: Self-pay

## 2015-10-29 ENCOUNTER — Encounter: Payer: Self-pay | Admitting: Neurology

## 2015-10-29 ENCOUNTER — Ambulatory Visit (INDEPENDENT_AMBULATORY_CARE_PROVIDER_SITE_OTHER): Payer: BLUE CROSS/BLUE SHIELD | Admitting: Neurology

## 2015-10-29 ENCOUNTER — Ambulatory Visit (INDEPENDENT_AMBULATORY_CARE_PROVIDER_SITE_OTHER): Payer: Self-pay | Admitting: Neurology

## 2015-10-29 DIAGNOSIS — M160 Bilateral primary osteoarthritis of hip: Secondary | ICD-10-CM

## 2015-10-29 DIAGNOSIS — M79604 Pain in right leg: Secondary | ICD-10-CM

## 2015-10-29 DIAGNOSIS — I10 Essential (primary) hypertension: Secondary | ICD-10-CM

## 2015-10-29 NOTE — Procedures (Signed)
     HISTORY:  Alex Hunter is a 60 year old gentleman with a history of right hip, and right leg and knee pain that began in October 2016. The patient has increased pain when he first tries to stand up, he may feel better once he walks a short distance. The patient is being evaluated for a possible lumbosacral radiculopathy.  NERVE CONDUCTION STUDIES:  Nerve conduction studies were performed on both lower extremities. The distal motor latencies and motor amplitudes for the peroneal and posterior tibial nerves were within normal limits. The nerve conduction velocities for these nerves were also normal. The H reflex latencies were normal. The sensory latencies for the peroneal nerves were within normal limits.   EMG STUDIES:  EMG study was performed on the right lower extremity:  The tibialis anterior muscle reveals 2 to 4K motor units with full recruitment. No fibrillations or positive waves were seen. The peroneus tertius muscle reveals 2 to 4K motor units with full recruitment. No fibrillations or positive waves were seen. The medial gastrocnemius muscle reveals 1 to 3K motor units with full recruitment. No fibrillations or positive waves were seen. The vastus lateralis muscle reveals 2 to 4K motor units with full recruitment. No fibrillations or positive waves were seen. The iliopsoas muscle reveals 2 to 4K motor units with full recruitment. No fibrillations or positive waves were seen. The biceps femoris muscle (long head) reveals 2 to 4K motor units with full recruitment. No fibrillations or positive waves were seen. The lumbosacral paraspinal muscles were tested at 3 levels, and revealed no abnormalities of insertional activity at all 3 levels tested. There was good relaxation.   IMPRESSION:  Nerve conduction studies done on both lower extremities were within normal limits. No evidence of a peripheral neuropathy is seen. EMG evaluation of the right lower extremity is unremarkable  without evidence of an overlying lumbosacral radiculopathy.  Jill Alexanders MD 10/29/2015 3:00 PM  [facility] [address] [place], [place] [address]  Phone [phone] Fax [phone]

## 2015-10-29 NOTE — Telephone Encounter (Signed)
Patient wants to talk to Philis Fendt. Patient states that he went to see a nerve specialist and they told him that his nerves are okay but he has a hip issue. Patient states that the medication prescribed hasn't been helping. Please advise!  [phone]

## 2015-10-29 NOTE — Progress Notes (Signed)
Please refer to EMG and nerve conduction study procedure note. 

## 2015-11-02 ENCOUNTER — Encounter: Payer: Self-pay | Admitting: Neurology

## 2015-11-02 ENCOUNTER — Other Ambulatory Visit: Payer: Self-pay | Admitting: Neurology

## 2015-11-02 ENCOUNTER — Telehealth: Payer: Self-pay | Admitting: Neurology

## 2015-11-02 ENCOUNTER — Ambulatory Visit
Admission: RE | Admit: 2015-11-02 | Discharge: 2015-11-02 | Disposition: A | Discharge status: Home / Self Care | Payer: BLUE CROSS/BLUE SHIELD | Source: Ambulatory Visit | Attending: Neurology | Admitting: Neurology

## 2015-11-02 ENCOUNTER — Ambulatory Visit (INDEPENDENT_AMBULATORY_CARE_PROVIDER_SITE_OTHER): Payer: BLUE CROSS/BLUE SHIELD | Admitting: Neurology

## 2015-11-02 VITALS — BP 137/82 | HR 75 | Ht 65.5 in | Wt 218.0 lb

## 2015-11-02 DIAGNOSIS — M25551 Pain in right hip: Secondary | ICD-10-CM

## 2015-11-02 DIAGNOSIS — M1611 Unilateral primary osteoarthritis, right hip: Secondary | ICD-10-CM

## 2015-11-02 NOTE — Telephone Encounter (Signed)
Will forward to [facility].  Philis Fendt, MS, PA-C 1:21 PM, 11/02/2015

## 2015-11-02 NOTE — Telephone Encounter (Signed)
I called the patient. The x-ray right hip shows arthritis, I will send him to an orthopedic surgeon.

## 2015-11-02 NOTE — Progress Notes (Signed)
Reason for visit: Right leg pain  Referring physician: Dr. Janace Hunter Alex Hunter is a 60 y.o. male  History of present illness:  Alex Hunter is a 60 year old right-handed black male with a history of right leg and hip discomfort that began in October 2016. Alex Hunter has had gradually worsening symptoms since that time, Alex onset was spontaneous. Alex Hunter has noted that if he sits for more than 15 minutes, he will have to stretch out his right leg in order to begin to walk. He has significant pain that is in Alex mid thigh and knee, he may have pain in Alex right hip and buttock area radiating into Alex right groin. He has lost mobility across Alex hip. He feels at times that Alex right leg may collapse, but he has not had any falls. He denies any numbness in Alex extremities. He denies any issues with Alex left leg or with Alex neck or Alex arms. He denies any issues controlling Alex bowels or Alex bladder. If he continues to walk, Alex pain is minimal and he does well. Initially, Alex use of Aleve seemed to help Alex discomfort, but this has lost its effectiveness. He has had EMG and nerve conduction study done through this office that was unremarkable.  Past Medical History  Diagnosis Date  . High cholesterol   . High blood pressure     Past Surgical History  Procedure Laterality Date  . Dental surgery      Family History  Problem Relation Age of Onset  . Heart disease Mother   . Leukemia Brother   . Heart attack Brother     Social history:  reports that he has quit smoking. His smoking use included Cigarettes. He does not have any smokeless tobacco history on file. He reports that he drinks alcohol. He reports that he does not use illicit drugs.  Medications:  Prior to Admission medications   Medication Sig Start Date End Date Taking? Authorizing Provider  cyclobenzaprine (AMRIX) 15 MG 24 hr capsule Take 1 capsule (15 mg total) by mouth daily as needed for muscle spasms. 08/30/15    Alex Eagles, PA-C  gabapentin (NEURONTIN) 300 MG capsule Start with 1 capsule on Day 1. Then take 1 capsule twice daily on Day 2. Thereafter, take 1 capsule three times daily. 10/07/15   Alex Eagles, PA-C  metFORMIN (GLUCOPHAGE) 500 MG tablet Take 1 tablet (500 mg total) by mouth daily with breakfast. 06/10/15   Alex Eagles, PA-C  traMADol (ULTRAM) 50 MG tablet Take 1 tablet (50 mg total) by mouth every 8 (eight) hours as needed. 10/07/15   Alex Eagles, PA-C     No Known Allergies  ROS:  Out of a complete 14 system review of symptoms, Alex Hunter complains only of Alex following symptoms, and all other reviewed systems are negative.  Cramps, aching muscles  Blood pressure 137/82, pulse 75, height 5' 5.5" (1.664 m), weight 218 lb (98.884 kg).  Physical Exam  General: Alex Hunter is alert and cooperative at Alex time of Alex examination.  Eyes: Pupils are equal, round, and reactive to light. Discs are flat bilaterally.  Neck: Alex neck is supple, no carotid bruits are noted.  Respiratory: Alex respiratory examination is clear.  Cardiovascular: Alex cardiovascular examination reveals a regular rate and rhythm, no obvious murmurs or rubs are noted.  Neuromuscular: Internal rotation of Alex right hip elicits pain, this is not seen on Alex left side.  Skin: Extremities are without significant  edema.  Neurologic Exam  Mental status: Alex Hunter is alert and oriented x 3 at Alex time of Alex examination. Alex Hunter has apparent normal recent and remote memory, with an apparently normal attention span and concentration ability.  Cranial nerves: Facial symmetry is present. There is good sensation of Alex face to pinprick and soft touch bilaterally. Alex strength of Alex facial muscles and Alex muscles to head turning and shoulder shrug are normal bilaterally. Speech is well enunciated, no aphasia or dysarthria is noted. Extraocular movements are full. Visual fields are full. Alex tongue is midline, and Alex  Hunter has symmetric elevation of Alex soft palate. No obvious hearing deficits are noted.  Motor: Alex motor testing reveals 5 over 5 strength of all 4 extremities. Good symmetric motor tone is noted throughout.  Sensory: Sensory testing is intact to pinprick, soft touch, vibration sensation, and position sense on all 4 extremities. No evidence of extinction is noted.  Coordination: Cerebellar testing reveals good finger-nose-finger and heel-to-shin bilaterally.  Gait and station: Gait is normal. Tandem gait is normal. Romberg is negative. No drift is seen.  Reflexes: Deep tendon reflexes are symmetric and normal bilaterally. Toes are downgoing bilaterally.   Assessment/Plan:  1. Right leg discomfort  Alex Hunter appears to have pain in Alex right leg that is neuromuscular in nature. EMG and nerve conduction studies show no evidence of nerve injury. Alex Hunter may have intrinsic joint disease, or possibly an iliotibial band syndrome. Alex Hunter will be sent for x-ray of Alex right hip. If this is unremarkable, we will consider a referral to an orthopedic surgeon, Alex Hunter may require MRI evaluation. I do not think that this issue his primarily neurologic in nature.  Addendum: XR of Alex right hip shows a moderate level of arthritis, we will make a referral to an orthopedic surgeon.  Alex Alexanders MD 11/02/2015 6:39 PM  Alex Hunter [address] [place], [place] [address]  Phone [phone] Fax [phone]

## 2015-11-04 ENCOUNTER — Telehealth: Payer: Self-pay

## 2015-11-04 NOTE — Telephone Encounter (Signed)
New patient eval faxed to PCP Dr. Daniel Nones F # [phone].

## 2015-11-04 NOTE — Telephone Encounter (Signed)
-----   Message from Kathrynn Ducking, MD sent at 11/02/2015  6:45 PM EDT ----- Please send the NP evaluation note to Dr. Elizebeth Koller. He is not in the EPIC system with the fax number. Thanks.

## 2015-11-05 ENCOUNTER — Telehealth: Payer: Self-pay | Admitting: Neurology

## 2015-11-05 NOTE — Telephone Encounter (Signed)
Pt called in about his referral, wanted to know status. Please call and advise

## 2015-11-05 NOTE — Telephone Encounter (Signed)
Called patient back and left him details of his apt      Sent referral to [facility]. # [id number] fax [phone]

## 2015-11-06 MED ORDER — PREDNISONE 20 MG PO TABS: ORAL_TABLET | ORAL | Status: DC

## 2015-11-06 NOTE — Telephone Encounter (Signed)
Patient was seen by Neurology. Nerve conduction studies were normal. Patient still has leg "locking", cramps and pain in his thighs down to the knee. He was sent for x-ray of the hip which showed significant OA of the hip. He will now undergo treatment for this with prednisone. Referral to ortho was placed.

## 2015-11-11 ENCOUNTER — Telehealth: Payer: Self-pay

## 2015-11-11 NOTE — Telephone Encounter (Signed)
-----   Message from Hendley sent at 11/10/2015 12:11 PM EDT ----- Regarding: Hipp x ray order Hi Dr. Jannifer Franklin Has a Hip XRAY click on Imaging Tab print order and fax order to [facility] [id number]. Thanks Hinton Dyer

## 2015-11-11 NOTE — Telephone Encounter (Signed)
Order faxed as requested

## 2016-12-07 ENCOUNTER — Encounter: Payer: Self-pay | Admitting: Urgent Care

## 2016-12-07 ENCOUNTER — Ambulatory Visit (INDEPENDENT_AMBULATORY_CARE_PROVIDER_SITE_OTHER): Payer: Self-pay | Admitting: Urgent Care

## 2016-12-07 VITALS — BP 136/83 | HR 77 | Temp 98.6°F | Resp 16 | Ht 65.0 in | Wt 219.0 lb

## 2016-12-07 DIAGNOSIS — Z6836 Body mass index (BMI) 36.0-36.9, adult: Secondary | ICD-10-CM

## 2016-12-07 DIAGNOSIS — Z01818 Encounter for other preprocedural examination: Secondary | ICD-10-CM

## 2016-12-07 DIAGNOSIS — E785 Hyperlipidemia, unspecified: Secondary | ICD-10-CM

## 2016-12-07 DIAGNOSIS — R739 Hyperglycemia, unspecified: Secondary | ICD-10-CM

## 2016-12-07 DIAGNOSIS — E669 Obesity, unspecified: Secondary | ICD-10-CM

## 2016-12-07 DIAGNOSIS — Z8679 Personal history of other diseases of the circulatory system: Secondary | ICD-10-CM

## 2016-12-07 LAB — POCT URINALYSIS DIP (MANUAL ENTRY)
BILIRUBIN UA: NEGATIVE mg/dL
Bilirubin, UA: NEGATIVE
Glucose, UA: NEGATIVE mg/dL
Nitrite, UA: NEGATIVE
Protein Ur, POC: NEGATIVE mg/dL
Spec Grav, UA: >=1.03 — AB (ref 1.010–1.025)
UROBILINOGEN UA: 0.2 U/dL
pH, UA: 5.5 (ref 5.0–8.0)

## 2016-12-07 NOTE — Patient Instructions (Signed)
     IF you received an x-ray today, you will receive an invoice from [facility]. Please contact [facility] at [phone] with questions or concerns regarding your invoice.   IF you received labwork today, you will receive an invoice from [facility]. Please contact [facility] at [phone] with questions or concerns regarding your invoice.   Our billing staff will not be able to assist you with questions regarding bills from these companies.  You will be contacted with the lab results as soon as they are available. The fastest way to get your results is to activate your My Chart account. Instructions are located on the last page of this paperwork. If you have not heard from us regarding the results in 2 weeks, please contact this office.     

## 2016-12-07 NOTE — Progress Notes (Signed)
  MRN: [id number] DOB: 1956-03-06  Subjective:   Alex Hunter is a 61 y.o. male presenting for surgical clearance. Reports having been diagnosed with osteoarthritis of his right hip and is now needing hip replacement. Denies dizziness, chronic headache, blurred vision, chest pain, shortness of breath, heart racing, palpitations, nausea, vomiting, abdominal pain, hematuria, lower leg swelling. Has a history of elevated blood sugar and used to take blood pressure medication as well. He is not currently taking any medications.   Alex Hunter is not currently taking any medications. Also has No Known Allergies.  Alex Hunter  has a past medical history of High blood pressure and High cholesterol. Also  has a past surgical history that includes Dental surgery.  Objective:   Vitals: BP 136/83   Pulse 77   Temp 98.6 F (37 C) (Oral)   Resp 16   Ht 5\' 5"  (1.651 m)   Wt 219 lb (99.3 kg)   SpO2 95%   BMI 36.44 kg/m   Physical Exam  Constitutional: He is oriented to person, place, and time. He appears well-developed and well-nourished.  HENT:  Mouth/Throat: Oropharynx is clear and moist.  Eyes: Pupils are equal, round, and reactive to light. EOM are normal. No scleral icterus.  Cardiovascular: Normal rate, regular rhythm and intact distal pulses.  Exam reveals no gallop and no friction rub.   No murmur heard. Pulmonary/Chest: No respiratory distress. He has no wheezes. He has no rales.  Abdominal: Soft. Bowel sounds are normal. He exhibits no distension and no mass. There is no tenderness. There is no guarding.  Musculoskeletal: He exhibits no edema.  Neurological: He is alert and oriented to person, place, and time.  Skin: Skin is warm and dry.  Psychiatric: He has a normal mood and affect.   Results for orders placed or performed in visit on 12/07/16 (from the past 24 hour(s))  POCT urinalysis dipstick     Status: Abnormal   Collection Time: 12/07/16  5:48 PM  Result Value Ref Range   Color,  UA yellow yellow   Clarity, UA clear clear   Glucose, UA negative negative mg/dL   Bilirubin, UA negative negative   Ketones, POC UA negative negative mg/dL   Spec Grav, UA >=1.030 (A) 1.010 - 1.025   Blood, UA trace-lysed (A) negative   pH, UA 5.5 5.0 - 8.0   Protein Ur, POC negative negative mg/dL   Urobilinogen, UA 0.2 0.2 or 1.0 E.U./dL   Nitrite, UA Negative Negative   Leukocytes, UA Trace (A) Negative   Assessment and Plan :   1. Pre-operative clearance 2. Hyperlipidemia, unspecified hyperlipidemia type 3. History of high blood pressure 4. Elevated blood sugar 5. Class 2 obesity without serious comorbidity with body mass index (BMI) of 36.0 to 36.9 in adult, unspecified obesity type - The Epic system was down during patient's office visit. We were unable to complete an ecg. Labs pending. Patient will rtc tomorrow to have ecg completed.   Jaynee Eagles, PA-C [facility] [phone] 12/07/2016  5:31 PM

## 2016-12-08 ENCOUNTER — Encounter: Payer: Self-pay | Admitting: Urgent Care

## 2016-12-08 ENCOUNTER — Ambulatory Visit (INDEPENDENT_AMBULATORY_CARE_PROVIDER_SITE_OTHER): Payer: BLUE CROSS/BLUE SHIELD | Admitting: Urgent Care

## 2016-12-08 VITALS — BP 132/90 | HR 79 | Resp 16 | Ht 65.0 in | Wt 218.2 lb

## 2016-12-08 DIAGNOSIS — Z01818 Encounter for other preprocedural examination: Secondary | ICD-10-CM

## 2016-12-08 DIAGNOSIS — Z8679 Personal history of other diseases of the circulatory system: Secondary | ICD-10-CM

## 2016-12-08 DIAGNOSIS — M1611 Unilateral primary osteoarthritis, right hip: Secondary | ICD-10-CM

## 2016-12-08 DIAGNOSIS — Z6836 Body mass index (BMI) 36.0-36.9, adult: Secondary | ICD-10-CM

## 2016-12-08 DIAGNOSIS — E669 Obesity, unspecified: Secondary | ICD-10-CM

## 2016-12-08 DIAGNOSIS — R739 Hyperglycemia, unspecified: Secondary | ICD-10-CM

## 2016-12-08 NOTE — Progress Notes (Signed)
    MRN: [id number] DOB: [birth year]  Subjective:   Alex Hunter is a 61 y.o. male presenting for follow up on surgical clearance. Patient was not able to complete blood work or ecg yesterday due to Epic system being down. We did complete his physical exam.   Alex Hunter is not currently taking any medications. Also has No Known Allergies.  [name]  has a past medical history of High blood pressure and High cholesterol. Also  has a past surgical history that includes Dental surgery.  Objective:   Vitals: BP 132/90   Pulse 79   Resp 16   Ht 5\' 5"  (1.651 m)   Wt 218 lb 3.2 oz (99 kg)   SpO2 97%   BMI 36.31 kg/m   BP Readings from Last 3 Encounters:  12/08/16 (!) 145/94  12/07/16 136/83  11/02/15 137/82    Physical Exam  Constitutional: He is oriented to person, place, and time. He appears well-developed and well-nourished.  Cardiovascular: Normal rate.   Pulmonary/Chest: Effort normal.  Neurological: He is alert and oriented to person, place, and time.   Results for orders placed or performed in visit on 12/07/16 (from the past 24 hour(s))  POCT urinalysis dipstick     Status: Abnormal   Collection Time: 12/07/16  5:48 PM  Result Value Ref Range   Color, UA yellow yellow   Clarity, UA clear clear   Glucose, UA negative negative mg/dL   Bilirubin, UA negative negative   Ketones, POC UA negative negative mg/dL   Spec Grav, UA >=1.030 (A) 1.010 - 1.025   Blood, UA trace-lysed (A) negative   pH, UA 5.5 5.0 - 8.0   Protein Ur, POC negative negative mg/dL   Urobilinogen, UA 0.2 0.2 or 1.0 E.U./dL   Nitrite, UA Negative Negative   Leukocytes, UA Trace (A) Negative   ECG interpretation by Dr. Mitchel Honour - Non-specific t-wave inversion in Lead III, follows the qrs complex of same lead, no previous ecg for comparison. Otherwise, sinus rhythm at 70bpm.  Assessment and Plan :   This case was precepted with Dr. Mitchel Honour.   1. Pre-operative clearance 2. Elevated blood sugar  level 3. History of hypertension 4. Class 2 obesity without serious comorbidity with body mass index (BMI) of 36.0 to 36.9 in adult, unspecified obesity type - Labs pending, will clear for his right hip surgery. Form will be completed after labs are resulted.  Alex Eagles, PA-C [facility] [phone] 12/08/2016 5:20 PM

## 2016-12-08 NOTE — Patient Instructions (Signed)
Partial Hip Replacement A hip replacement is a surgical procedure to remove damaged bone in your hip joint and replace it with an artificial hip joint (prosthetic hip joint). The purpose of this surgery is to reduce pain and improve your hip function. A partial hip replacement most often refers to a surgery to replace only part of the joint. The healthy parts of the hip are left intact. The new part of the hip is made of materials that allow a normal movement of the joint. Tell a health care provider about:  Any allergies you have.  All medicines you are taking, including vitamins, herbs, eye drops, creams, and over-the-counter medicines.  Any problems you or family members have had with anesthetic medicines.  Any blood disorders you have.  Any surgeries you have had.  Any medical conditions you have. What are the risks? Generally, this is a safe procedure. However, problems can occur and include:  Reaction to anesthetics.  Damage to surrounding nerves, tissues, or structures.  Infection.  Blood clot.  Bleeding.  Scarring.  Prosthesis not staying attached to the bone.  Joint dislocation.  Continued stiffness or loss of mobility.  Continued pain.  What happens before the procedure?  You may have an exam or tests.  Ask your health care provider about: ? Changing or stopping your regular medicines. This is especially important if you are taking diabetes medicines or blood thinners. ? Taking medicines such as aspirin and ibuprofen. These medicines can thin your blood. Do not take these medicines before your procedure if your health care provider asks you not to.  Do not eat or drink anything after midnight on the night before the procedure or as directed by your health care provider.  If directed by your health care provider, bathe with an antibacterial soap the night before the surgery or the morning of the surgery.  Do not use any tobacco products, including  cigarettes, chewing tobacco, or electronic cigarettes. If you need help quitting, ask your health care provider. Tobacco and nicotine products can delay healing after your surgery.  Have elective dental care and routine cleanings before your surgery.  Plan to have someone take you home after the procedure. Also arrange for someone to help you with activities during recovery.  Ask your health care provider about how your surgical site will be marked or identified.  You may be given antibiotic medicines to help prevent infection. What happens during the procedure?  To reduce your risk of infection: ? Your health care team will wash or sanitize their hands. ? Your skin will be washed with soap.  You will be given one of the following: ? A medicine that makes you fall asleep (general anesthetic). ? A medicine injected into your spine that numbs your body below the waist (spinal anesthetic).  The surgeon will make a cut (incision) in the hip to access the joint.  The top of the thighbone that contains the ball will be removed.  The surgeon will secure a new prosthetic ball joint into the healthy socket. The nearby bone may grow into the prosthesis to hold it in place, or cement may be used.  The incision will be closed with stitches (sutures). What happens after the procedure?  You will be taken to a recovery area.  Your blood pressure, heart rate, breathing rate, and blood oxygen level will be monitored often until the medicines you were given have worn off.  Once you are awake and stable you will be taken  to a hospital room.  You will receive medicines to help manage pain and swelling.  You will be asked to perform breathing exercises.  You may be directed to take actions to help prevent blood clots. These may include: ? Walking soon after surgery, with someone assisting you. Moving around after surgery helps to improve blood flow. ? Taking medicines to thin your blood  (anticoagulants). ? Wearing compression stockings or using different types of devices.  You will receive physical therapy until you are doing well and your health care provider feels it is safe for you to go home. This information is not intended to replace advice given to you by your health care provider. Make sure you discuss any questions you have with your health care provider. Document Released: 10/06/2009 Document Revised: 09/24/2015 Document Reviewed: 08/20/2013 Elsevier Interactive Patient Education  2017 [employer].

## 2016-12-09 LAB — COMPREHENSIVE METABOLIC PANEL
A/G RATIO: 1.7 (ref 1.2–2.2)
ALK PHOS: 93 IU/L (ref 39–117)
ALT: 21 IU/L (ref 0–44)
AST: 22 IU/L (ref 0–40)
Albumin: 4.3 g/dL (ref 3.5–5.5)
BUN/Creatinine Ratio: 10 (ref 9–20)
BUN: 11 mg/dL (ref 6–24)
CHLORIDE: 104 mmol/L (ref 96–106)
CO2: 24 mmol/L (ref 20–29)
Calcium: 9.3 mg/dL (ref 8.7–10.2)
Creatinine, Ser: 1.11 mg/dL (ref 0.76–1.27)
GFR calc Af Amer: 84 mL/min/{1.73_m2} (ref >59)
GFR calc non Af Amer: 72 mL/min/{1.73_m2} (ref >59)
Globulin, Total: 2.6 g/dL (ref 1.5–4.5)
Glucose: 82 mg/dL (ref 65–99)
POTASSIUM: 4.3 mmol/L (ref 3.5–5.2)
Sodium: 143 mmol/L (ref 134–144)
Total Protein: 6.9 g/dL (ref 6.0–8.5)

## 2016-12-09 LAB — CBC
HEMATOCRIT: 48.1 % (ref 37.5–51.0)
Hemoglobin: 15.5 g/dL (ref 13.0–17.7)
MCH: 26.7 pg (ref 26.6–33.0)
MCHC: 32.2 g/dL (ref 31.5–35.7)
MCV: 83 fL (ref 79–97)
Platelets: 270 10*3/uL (ref 150–379)
RBC: 5.81 x10E6/uL — ABNORMAL HIGH (ref 4.14–5.80)
RDW: 15.2 % (ref 12.3–15.4)
WBC: 7.8 10*3/uL (ref 3.4–10.8)

## 2016-12-09 LAB — PT AND PTT
APTT: 31 s (ref 24–33)
INR: 1 (ref 0.8–1.2)
PROTHROMBIN TIME: 10.7 s (ref 9.1–12.0)

## 2016-12-09 LAB — HEMOGLOBIN A1C
ESTIMATED AVERAGE GLUCOSE: 137 mg/dL
HEMOGLOBIN A1C: 6.4 % — AB (ref 4.8–5.6)

## 2016-12-20 ENCOUNTER — Ambulatory Visit: Payer: Self-pay | Admitting: Orthopedic Surgery

## 2016-12-22 ENCOUNTER — Ambulatory Visit: Payer: Self-pay | Admitting: Orthopedic Surgery

## 2016-12-22 NOTE — H&P (Signed)
TOTAL HIP ADMISSION H&P  Patient is admitted for right total hip arthroplasty.  Subjective:  Chief Complaint: right hip pain  HPI: Alex Hunter, 61 y.o. male, has a history of pain and functional disability in the right hip(s) due to arthritis and patient has failed non-surgical conservative treatments for greater than 12 weeks to include NSAID's and/or analgesics, flexibility and strengthening excercises, use of assistive devices, weight reduction as appropriate and activity modification.  Onset of symptoms was gradual starting >10 years ago with gradually worsening course since that time.The patient noted no past surgery on the right hip(s).  Patient currently rates pain in the right hip at 10 out of 10 with activity. Patient has night pain, worsening of pain with activity and weight bearing, pain that interfers with activities of daily living, pain with passive range of motion and crepitus. Patient has evidence of subchondral cysts, subchondral sclerosis, periarticular osteophytes and joint space narrowing by imaging studies. This condition presents safety issues increasing the risk of falls.  There is no current active infection.  Patient Active Problem List   Diagnosis Date Noted  . Essential hypertension 03/06/2012  . Perirectal abscess 03/06/2012  . Hyperlipidemia 11/19/2009   Past Medical History:  Diagnosis Date  . High blood pressure   . High cholesterol     Past Surgical History:  Procedure Laterality Date  . DENTAL SURGERY       (Not in a hospital admission) No Known Allergies  Social History  Substance Use Topics  . Smoking status: Former Smoker    Types: Cigarettes  . Smokeless tobacco: Never Used  . Alcohol use Yes     Comment: occ    Family History  Problem Relation Age of Onset  . Heart disease Mother   . Leukemia Brother   . Heart attack Brother      Review of Systems  Constitutional: Negative.   HENT: Negative.   Eyes: Negative.   Respiratory:  Negative.   Cardiovascular: Negative.   Gastrointestinal: Negative.   Genitourinary: Negative.   Musculoskeletal: Positive for joint pain.  Skin: Negative.   Neurological: Negative.   Endo/Heme/Allergies: Negative.   Psychiatric/Behavioral: Negative.     Objective:  Physical Exam  Vitals reviewed. Constitutional: He is oriented to person, place, and time. He appears well-developed and well-nourished.  HENT:  Head: Normocephalic and atraumatic.  Eyes: Pupils are equal, round, and reactive to light. Conjunctivae and EOM are normal.  Neck: Normal range of motion. Neck supple.  Cardiovascular: Normal rate, regular rhythm and intact distal pulses.   Respiratory: Effort normal. No respiratory distress.  GI: Soft. He exhibits no distension.  Genitourinary:  Genitourinary Comments: deferred  Musculoskeletal:       Right hip: He exhibits decreased range of motion and crepitus.  Neurological: He is alert and oriented to person, place, and time. He has normal reflexes.  Skin: Skin is warm and dry.  Psychiatric: He has a normal mood and affect. His behavior is normal. Judgment and thought content normal.    Vital signs in last 24 hours: @VSRANGES @  Labs:   Estimated body mass index is 36.31 kg/m as calculated from the following:   Height as of 12/08/16: 5\' 5"  (1.651 m).   Weight as of 12/08/16: 99 kg (218 lb 3.2 oz).   Imaging Review Plain radiographs demonstrate severe degenerative joint disease of the right hip(s). The bone quality appears to be adequate for age and reported activity level.  Assessment/Plan:  End stage arthritis, right hip(s)  The patient history, physical examination, clinical judgement of the provider and imaging studies are consistent with end stage degenerative joint disease of the right hip(s) and total hip arthroplasty is deemed medically necessary. The treatment options including medical management, injection therapy, arthroscopy and arthroplasty were  discussed at length. The risks and benefits of total hip arthroplasty were presented and reviewed. The risks due to aseptic loosening, infection, stiffness, dislocation/subluxation,  thromboembolic complications and other imponderables were discussed.  The patient acknowledged the explanation, agreed to proceed with the plan and consent was signed. Patient is being admitted for inpatient treatment for surgery, pain control, PT, OT, prophylactic antibiotics, VTE prophylaxis, progressive ambulation and ADL's and discharge planning.The patient is planning to be discharged home with HEP. Needs RW.

## 2016-12-27 NOTE — Pre-Procedure Instructions (Signed)
Alex Hunter  12/27/2016      [facility], [place] - [place] AT [address] [place] [address] Phone: [phone] Fax: [phone]    Your procedure is scheduled on Monday September 10.  Report to [facility] Admitting at 5:30 A.M.  Call this number if you have problems the morning of surgery:  [phone]   Remember:  Do not eat food or drink liquids after midnight.  Take these medicines the morning of surgery with A SIP OF WATER: NONE  7 days prior to surgery STOP taking any Aspirin, Aleve, Naproxen, Ibuprofen, Motrin, Advil, Goody's, BC's, all herbal medications, fish oil, and all vitamins    Do not wear jewelry, make-up or nail polish.  Do not wear lotions, powders, or perfumes, or deoderant.  Do not shave 48 hours prior to surgery.  Men may shave face and neck.  Do not bring valuables to the hospital.  [facility] is not responsible for any belongings or valuables.  Contacts, dentures or bridgework may not be worn into surgery.  Leave your suitcase in the car.  After surgery it may be brought to your room.  For patients admitted to the hospital, discharge time will be determined by your treatment team.  Patients discharged the day of surgery will not be allowed to drive home.    Special instructions:    [place]- Preparing For Surgery  Before surgery, you can play an important role. Because skin is not sterile, your skin needs to be as free of germs as possible. You can reduce the number of germs on your skin by washing with CHG (chlorahexidine gluconate) Soap before surgery.  CHG is an antiseptic cleaner which kills germs and bonds with the skin to continue killing germs even after washing.  Please do not use if you have an allergy to CHG or antibacterial soaps. If your skin becomes reddened/irritated stop using the CHG.  Do not shave (including legs and underarms) for at least  48 hours prior to first CHG shower. It is OK to shave your face.  Please follow these instructions carefully.   1. Shower the NIGHT BEFORE SURGERY and the MORNING OF SURGERY with CHG.   2. If you chose to wash your hair, wash your hair first as usual with your normal shampoo.  3. After you shampoo, rinse your hair and body thoroughly to remove the shampoo.  4. Use CHG as you would any other liquid soap. You can apply CHG directly to the skin and wash gently with a scrungie or a clean washcloth.   5. Apply the CHG Soap to your body ONLY FROM THE NECK DOWN.  Do not use on open wounds or open sores. Avoid contact with your eyes, ears, mouth and genitals (private parts). Wash genitals (private parts) with your normal soap.  6. Wash thoroughly, paying special attention to the area where your surgery will be performed.  7. Thoroughly rinse your body with warm water from the neck down.  8. DO NOT shower/wash with your normal soap after using and rinsing off the CHG Soap.  9. Pat yourself dry with a CLEAN TOWEL.   10. Wear CLEAN PAJAMAS   11. Place CLEAN SHEETS on your bed the night of your first shower and DO NOT SLEEP WITH PETS.    Day of Surgery: Do not apply any deodorants/lotions. Please wear clean clothes to  the hospital/surgery center.      Please read over the following fact sheets that you were given. Total Joint Packet and MRSA Information

## 2016-12-28 ENCOUNTER — Encounter (HOSPITAL_COMMUNITY)
Admission: RE | Admit: 2016-12-28 | Discharge: 2016-12-28 | Disposition: A | Discharge status: Home / Self Care | Payer: BLUE CROSS/BLUE SHIELD | Source: Ambulatory Visit | Attending: Orthopedic Surgery | Admitting: Orthopedic Surgery

## 2016-12-28 ENCOUNTER — Encounter (HOSPITAL_COMMUNITY): Payer: Self-pay

## 2016-12-28 DIAGNOSIS — Z01812 Encounter for preprocedural laboratory examination: Secondary | ICD-10-CM | POA: Insufficient documentation

## 2016-12-28 LAB — BASIC METABOLIC PANEL
ANION GAP: 9 (ref 5–15)
BUN: 7 mg/dL (ref 6–20)
CHLORIDE: 105 mmol/L (ref 101–111)
CO2: 25 mmol/L (ref 22–32)
Calcium: 8.9 mg/dL (ref 8.9–10.3)
Creatinine, Ser: 1.02 mg/dL (ref 0.61–1.24)
GFR calc non Af Amer: >60 mL/min (ref >60)
GLUCOSE: 101 mg/dL — AB (ref 65–99)
Potassium: 3.9 mmol/L (ref 3.5–5.1)
Sodium: 139 mmol/L (ref 135–145)

## 2016-12-28 LAB — CBC
HEMATOCRIT: 47.5 % (ref 39.0–52.0)
HEMOGLOBIN: 15.4 g/dL (ref 13.0–17.0)
MCH: 26.6 pg (ref 26.0–34.0)
MCHC: 32.4 g/dL (ref 30.0–36.0)
MCV: 82 fL (ref 78.0–100.0)
Platelets: 257 10*3/uL (ref 150–400)
RBC: 5.79 MIL/uL (ref 4.22–5.81)
RDW: 15.1 % (ref 11.5–15.5)
WBC: 6.1 10*3/uL (ref 4.0–10.5)

## 2016-12-28 LAB — TYPE AND SCREEN
ABO/RH(D): A POS
ANTIBODY SCREEN: NEGATIVE

## 2016-12-28 LAB — SURGICAL PCR SCREEN
MRSA, PCR: NEGATIVE
Staphylococcus aureus: NEGATIVE

## 2016-12-28 LAB — ABO/RH: ABO/RH(D): A POS

## 2016-12-28 MED ORDER — CHLORHEXIDINE GLUCONATE 4 % EX LIQD: 60.0000 mL | Freq: Once | CUTANEOUS | Status: DC

## 2016-12-28 NOTE — Progress Notes (Signed)
OIZ:TIWP, Freida Busman, [place]  Cardiologist: pt denies  EKG: 12/08/16  Stress test:  Pt denies ever  ECHO: pt denies ever  Cardiac Cath: pt denies ever  Chest x-ray: pt denies past year

## 2017-01-06 MED ORDER — TRANEXAMIC ACID 1000 MG/10ML IV SOLN
1000.0000 mg | INTRAVENOUS | Status: AC
  Administered 2017-01-09: 1000 mg via INTRAVENOUS
  Filled 2017-01-06: qty 1100

## 2017-01-08 MED ORDER — ACETAMINOPHEN 10 MG/ML IV SOLN
1000.0000 mg | INTRAVENOUS | Status: AC
  Administered 2017-01-09: 1000 mg via INTRAVENOUS
  Filled 2017-01-08: qty 100

## 2017-01-08 MED ORDER — CEFAZOLIN SODIUM-DEXTROSE 2-4 GM/100ML-% IV SOLN
2.0000 g | INTRAVENOUS | Status: AC
  Administered 2017-01-09: 2 g via INTRAVENOUS
  Filled 2017-01-08: qty 100

## 2017-01-08 NOTE — Anesthesia Preprocedure Evaluation (Addendum)
Anesthesia Evaluation  Patient identified by MRN, date of birth, ID band Patient awake    Reviewed: Allergy & Precautions, NPO status , Patient's Chart, lab work & pertinent test results  Airway Mallampati: III  TM Distance: >3 FB Neck ROM: Full    Dental  (+) Dental Advisory Given, Teeth Intact   Pulmonary former smoker,    Pulmonary exam normal breath sounds clear to auscultation       Cardiovascular hypertension, Normal cardiovascular exam Rhythm:Regular Rate:Normal     Neuro/Psych negative neurological ROS  negative psych ROS   GI/Hepatic negative GI ROS, Neg liver ROS,   Endo/Other  Obesity  Renal/GU negative Renal ROS  negative genitourinary   Musculoskeletal negative musculoskeletal ROS (+)   Abdominal   Peds  Hematology negative hematology ROS (+)   Anesthesia Other Findings   Reproductive/Obstetrics                            Anesthesia Physical Anesthesia Plan  ASA: II  Anesthesia Plan: Spinal   Post-op Pain Management:    Induction: Intravenous  PONV Risk Score and Plan: Treatment may vary due to age or medical condition and Propofol infusion  Airway Management Planned: Nasal Cannula  Additional Equipment: None  Intra-op Plan:   Post-operative Plan: Extubation in OR  Informed Consent: I have reviewed the patients History and Physical, chart, labs and discussed the procedure including the risks, benefits and alternatives for the proposed anesthesia with the patient or authorized representative who has indicated his/her understanding and acceptance.   Dental advisory given  Plan Discussed with: CRNA  Anesthesia Plan Comments:         Anesthesia Quick Evaluation

## 2017-01-09 ENCOUNTER — Inpatient Hospital Stay (HOSPITAL_COMMUNITY)
Admission: RE | Admit: 2017-01-09 | Discharge: 2017-01-10 | DRG: 470 | Disposition: A | Discharge status: Home Health | Payer: BLUE CROSS/BLUE SHIELD | Source: Ambulatory Visit | Attending: Orthopedic Surgery | Admitting: Orthopedic Surgery

## 2017-01-09 ENCOUNTER — Inpatient Hospital Stay (HOSPITAL_COMMUNITY): Payer: BLUE CROSS/BLUE SHIELD

## 2017-01-09 ENCOUNTER — Encounter (HOSPITAL_COMMUNITY): Payer: Self-pay | Admitting: Certified Registered"

## 2017-01-09 ENCOUNTER — Inpatient Hospital Stay (HOSPITAL_COMMUNITY): Payer: BLUE CROSS/BLUE SHIELD | Admitting: Anesthesiology

## 2017-01-09 ENCOUNTER — Encounter (HOSPITAL_COMMUNITY)
Admission: RE | Disposition: A | Discharge status: Home Health | Payer: Self-pay | Source: Ambulatory Visit | Attending: Orthopedic Surgery

## 2017-01-09 DIAGNOSIS — Z8249 Family history of ischemic heart disease and other diseases of the circulatory system: Secondary | ICD-10-CM | POA: Diagnosis not present

## 2017-01-09 DIAGNOSIS — Z419 Encounter for procedure for purposes other than remedying health state, unspecified: Secondary | ICD-10-CM

## 2017-01-09 DIAGNOSIS — Z09 Encounter for follow-up examination after completed treatment for conditions other than malignant neoplasm: Secondary | ICD-10-CM

## 2017-01-09 DIAGNOSIS — Z87891 Personal history of nicotine dependence: Secondary | ICD-10-CM

## 2017-01-09 DIAGNOSIS — M1611 Unilateral primary osteoarthritis, right hip: Secondary | ICD-10-CM | POA: Diagnosis present

## 2017-01-09 DIAGNOSIS — Z806 Family history of leukemia: Secondary | ICD-10-CM

## 2017-01-09 HISTORY — PX: TOTAL HIP ARTHROPLASTY: SHX124

## 2017-01-09 HISTORY — DX: Unspecified osteoarthritis, unspecified site: M19.90

## 2017-01-09 SURGERY — ARTHROPLASTY, HIP, TOTAL, ANTERIOR APPROACH
Anesthesia: Spinal | Laterality: Right

## 2017-01-09 MED ORDER — MIDAZOLAM HCL 2 MG/2ML IJ SOLN
INTRAMUSCULAR | Status: AC
  Filled 2017-01-09: qty 2

## 2017-01-09 MED ORDER — FENTANYL CITRATE (PF) 100 MCG/2ML IJ SOLN: 25.0000 ug | INTRAMUSCULAR | Status: DC | PRN

## 2017-01-09 MED ORDER — KETOROLAC TROMETHAMINE 30 MG/ML IJ SOLN
INTRAMUSCULAR | Status: DC | PRN
  Administered 2017-01-09: 30 mg

## 2017-01-09 MED ORDER — METOCLOPRAMIDE HCL 5 MG/ML IJ SOLN
5.0000 mg | Freq: Three times a day (TID) | INTRAMUSCULAR | Status: DC | PRN
  Administered 2017-01-09: 10 mg via INTRAVENOUS
  Filled 2017-01-09: qty 2

## 2017-01-09 MED ORDER — ONDANSETRON HCL 4 MG PO TABS
4.0000 mg | ORAL_TABLET | Freq: Four times a day (QID) | ORAL | Status: DC | PRN
  Administered 2017-01-09: 4 mg via ORAL
  Filled 2017-01-09: qty 1

## 2017-01-09 MED ORDER — ONDANSETRON HCL 4 MG/2ML IJ SOLN: 4.0000 mg | Freq: Four times a day (QID) | INTRAMUSCULAR | Status: DC | PRN

## 2017-01-09 MED ORDER — FENTANYL CITRATE (PF) 250 MCG/5ML IJ SOLN
INTRAMUSCULAR | Status: AC
Start: 2017-01-09 — End: 2017-01-09
  Filled 2017-01-09: qty 5

## 2017-01-09 MED ORDER — BUPIVACAINE IN DEXTROSE 0.75-8.25 % IT SOLN
INTRATHECAL | Status: DC | PRN
  Administered 2017-01-09: 1.8 mL via INTRATHECAL

## 2017-01-09 MED ORDER — 0.9 % SODIUM CHLORIDE (POUR BTL) OPTIME
TOPICAL | Status: DC | PRN
  Administered 2017-01-09: 1000 mL

## 2017-01-09 MED ORDER — HYDROCODONE-ACETAMINOPHEN 5-325 MG PO TABS
1.0000 | ORAL_TABLET | ORAL | Status: DC | PRN
  Administered 2017-01-09 – 2017-01-10 (×4): 2 via ORAL
  Filled 2017-01-09 (×3): qty 2

## 2017-01-09 MED ORDER — HYDROMORPHONE HCL 1 MG/ML IJ SOLN
INTRAMUSCULAR | Status: AC
  Filled 2017-01-09: qty 1

## 2017-01-09 MED ORDER — BUPIVACAINE-EPINEPHRINE (PF) 0.5% -1:200000 IJ SOLN
INTRAMUSCULAR | Status: AC
  Filled 2017-01-09: qty 30

## 2017-01-09 MED ORDER — MIDAZOLAM HCL 5 MG/5ML IJ SOLN
INTRAMUSCULAR | Status: DC | PRN
  Administered 2017-01-09: 2 mg via INTRAVENOUS

## 2017-01-09 MED ORDER — TRANEXAMIC ACID 1000 MG/10ML IV SOLN
1000.0000 mg | Freq: Once | INTRAVENOUS | Status: AC
  Administered 2017-01-09: 1000 mg via INTRAVENOUS
  Filled 2017-01-09: qty 10

## 2017-01-09 MED ORDER — KETOROLAC TROMETHAMINE 15 MG/ML IJ SOLN
INTRAMUSCULAR | Status: AC
  Administered 2017-01-09: 15 mg via INTRAVENOUS
  Filled 2017-01-09: qty 1

## 2017-01-09 MED ORDER — KETOROLAC TROMETHAMINE 15 MG/ML IJ SOLN
INTRAMUSCULAR | Status: AC
  Filled 2017-01-09: qty 1

## 2017-01-09 MED ORDER — DEXAMETHASONE SODIUM PHOSPHATE 10 MG/ML IJ SOLN
INTRAMUSCULAR | Status: DC | PRN
  Administered 2017-01-09: 10 mg via INTRAVENOUS

## 2017-01-09 MED ORDER — ONDANSETRON HCL 4 MG/2ML IJ SOLN
INTRAMUSCULAR | Status: AC
  Filled 2017-01-09: qty 2

## 2017-01-09 MED ORDER — PROMETHAZINE HCL 25 MG/ML IJ SOLN: 6.2500 mg | INTRAMUSCULAR | Status: DC | PRN

## 2017-01-09 MED ORDER — PHENOL 1.4 % MT LIQD: 1.0000 | OROMUCOSAL | Status: DC | PRN

## 2017-01-09 MED ORDER — DOCUSATE SODIUM 100 MG PO CAPS
100.0000 mg | ORAL_CAPSULE | Freq: Two times a day (BID) | ORAL | Status: DC
  Administered 2017-01-09 – 2017-01-10 (×2): 100 mg via ORAL
  Filled 2017-01-09: qty 1

## 2017-01-09 MED ORDER — KETOROLAC TROMETHAMINE 30 MG/ML IJ SOLN
INTRAMUSCULAR | Status: AC
  Filled 2017-01-09: qty 1

## 2017-01-09 MED ORDER — ONDANSETRON HCL 4 MG/2ML IJ SOLN
INTRAMUSCULAR | Status: DC | PRN
  Administered 2017-01-09: 4 mg via INTRAVENOUS

## 2017-01-09 MED ORDER — METHOCARBAMOL 500 MG PO TABS
ORAL_TABLET | ORAL | Status: AC
  Administered 2017-01-09: 500 mg via ORAL
  Filled 2017-01-09: qty 1

## 2017-01-09 MED ORDER — ACETAMINOPHEN 650 MG RE SUPP: 650.0000 mg | Freq: Four times a day (QID) | RECTAL | Status: DC | PRN

## 2017-01-09 MED ORDER — POLYETHYLENE GLYCOL 3350 17 G PO PACK: 17.0000 g | PACK | Freq: Every day | ORAL | Status: DC | PRN

## 2017-01-09 MED ORDER — SODIUM CHLORIDE 0.9 % IV SOLN
INTRAVENOUS | Status: AC | PRN
  Administered 2017-01-09: 250 mL

## 2017-01-09 MED ORDER — SODIUM CHLORIDE 0.9 % IJ SOLN
INTRAMUSCULAR | Status: DC | PRN
  Administered 2017-01-09: 30 mL

## 2017-01-09 MED ORDER — DEXAMETHASONE SODIUM PHOSPHATE 10 MG/ML IJ SOLN
INTRAMUSCULAR | Status: AC
  Filled 2017-01-09: qty 1

## 2017-01-09 MED ORDER — ASPIRIN 81 MG PO CHEW
81.0000 mg | CHEWABLE_TABLET | Freq: Two times a day (BID) | ORAL | Status: DC
  Administered 2017-01-09 – 2017-01-10 (×2): 81 mg via ORAL
  Filled 2017-01-09 (×2): qty 1

## 2017-01-09 MED ORDER — SODIUM CHLORIDE 0.9 % IV SOLN
INTRAVENOUS | Status: DC
  Administered 2017-01-09: 15:00:00 via INTRAVENOUS

## 2017-01-09 MED ORDER — HYDROCODONE-ACETAMINOPHEN 5-325 MG PO TABS
ORAL_TABLET | ORAL | Status: AC
  Administered 2017-01-09: 2 via ORAL
  Filled 2017-01-09: qty 2

## 2017-01-09 MED ORDER — DIPHENHYDRAMINE HCL 12.5 MG/5ML PO ELIX: 12.5000 mg | ORAL_SOLUTION | ORAL | Status: DC | PRN

## 2017-01-09 MED ORDER — METOCLOPRAMIDE HCL 5 MG PO TABS: 5.0000 mg | ORAL_TABLET | Freq: Three times a day (TID) | ORAL | Status: DC | PRN

## 2017-01-09 MED ORDER — FENTANYL CITRATE (PF) 100 MCG/2ML IJ SOLN
INTRAMUSCULAR | Status: DC | PRN
  Administered 2017-01-09 (×2): 50 ug via INTRAVENOUS
  Administered 2017-01-09: 100 ug via INTRAVENOUS
  Administered 2017-01-09: 50 ug via INTRAVENOUS

## 2017-01-09 MED ORDER — LACTATED RINGERS IV SOLN
INTRAVENOUS | Status: DC | PRN
  Administered 2017-01-09: 07:00:00 via INTRAVENOUS

## 2017-01-09 MED ORDER — SENNA 8.6 MG PO TABS
2.0000 | ORAL_TABLET | Freq: Every day | ORAL | Status: DC
  Administered 2017-01-09: 17.2 mg via ORAL
  Filled 2017-01-09: qty 2

## 2017-01-09 MED ORDER — POVIDONE-IODINE 10 % EX SWAB: 2.0000 "application " | Freq: Once | CUTANEOUS | Status: DC

## 2017-01-09 MED ORDER — DEXAMETHASONE SODIUM PHOSPHATE 10 MG/ML IJ SOLN
10.0000 mg | Freq: Once | INTRAMUSCULAR | Status: AC
  Administered 2017-01-10: 10 mg via INTRAVENOUS
  Filled 2017-01-09: qty 1

## 2017-01-09 MED ORDER — SODIUM CHLORIDE 0.9 % IR SOLN
Status: DC | PRN
Start: 2017-01-09 — End: 2017-01-09
  Administered 2017-01-09 (×2): 3000 mL

## 2017-01-09 MED ORDER — PHENYLEPHRINE HCL 10 MG/ML IJ SOLN
INTRAMUSCULAR | Status: DC | PRN
  Administered 2017-01-09: 120 ug via INTRAVENOUS

## 2017-01-09 MED ORDER — PROPOFOL 500 MG/50ML IV EMUL
INTRAVENOUS | Status: DC | PRN
  Administered 2017-01-09: 30 ug/kg/min via INTRAVENOUS

## 2017-01-09 MED ORDER — KETOROLAC TROMETHAMINE 15 MG/ML IJ SOLN
15.0000 mg | Freq: Four times a day (QID) | INTRAMUSCULAR | Status: AC
  Administered 2017-01-09 – 2017-01-10 (×4): 15 mg via INTRAVENOUS
  Filled 2017-01-09 (×3): qty 1

## 2017-01-09 MED ORDER — MENTHOL 3 MG MT LOZG: 1.0000 | LOZENGE | OROMUCOSAL | Status: DC | PRN

## 2017-01-09 MED ORDER — ACETAMINOPHEN 325 MG PO TABS: 650.0000 mg | ORAL_TABLET | Freq: Four times a day (QID) | ORAL | Status: DC | PRN

## 2017-01-09 MED ORDER — METHOCARBAMOL 500 MG PO TABS
500.0000 mg | ORAL_TABLET | Freq: Four times a day (QID) | ORAL | Status: DC | PRN
  Administered 2017-01-09 – 2017-01-10 (×2): 500 mg via ORAL
  Filled 2017-01-09: qty 1

## 2017-01-09 MED ORDER — PROPOFOL 10 MG/ML IV BOLUS
INTRAVENOUS | Status: AC
  Filled 2017-01-09: qty 20

## 2017-01-09 MED ORDER — SODIUM CHLORIDE 0.9 % IV SOLN: INTRAVENOUS | Status: DC

## 2017-01-09 MED ORDER — METHOCARBAMOL 1000 MG/10ML IJ SOLN
500.0000 mg | Freq: Four times a day (QID) | INTRAVENOUS | Status: DC | PRN
  Filled 2017-01-09: qty 5

## 2017-01-09 MED ORDER — BUPIVACAINE-EPINEPHRINE (PF) 0.5% -1:200000 IJ SOLN
INTRAMUSCULAR | Status: DC | PRN
  Administered 2017-01-09: 30 mL

## 2017-01-09 MED ORDER — CEFAZOLIN SODIUM-DEXTROSE 2-4 GM/100ML-% IV SOLN
2.0000 g | Freq: Four times a day (QID) | INTRAVENOUS | Status: AC
  Administered 2017-01-09 (×2): 2 g via INTRAVENOUS
  Filled 2017-01-09 (×2): qty 100

## 2017-01-09 MED ORDER — HYDROMORPHONE HCL 1 MG/ML IJ SOLN
0.5000 mg | INTRAMUSCULAR | Status: DC | PRN
  Administered 2017-01-09: 1 mg via INTRAVENOUS

## 2017-01-09 SURGICAL SUPPLY — 51 items
ADH SKN CLS APL DERMABOND .7 (GAUZE/BANDAGES/DRESSINGS) ×1
ALCOHOL ISOPROPYL (RUBBING) (MISCELLANEOUS) ×2 IMPLANT
CAPT HIP TOTAL 2 ×1 IMPLANT
CHLORAPREP W/TINT 26ML (MISCELLANEOUS) ×2 IMPLANT
COVER SURGICAL LIGHT HANDLE (MISCELLANEOUS) ×2 IMPLANT
DERMABOND ADVANCED (GAUZE/BANDAGES/DRESSINGS) ×1
DERMABOND ADVANCED .7 DNX12 (GAUZE/BANDAGES/DRESSINGS) ×2 IMPLANT
DRAPE C-ARM 42X72 X-RAY (DRAPES) ×2 IMPLANT
DRAPE STERI IOBAN 125X83 (DRAPES) ×2 IMPLANT
DRAPE U-SHAPE 47X51 STRL (DRAPES) ×6 IMPLANT
DRSG AQUACEL AG ADV 3.5X10 (GAUZE/BANDAGES/DRESSINGS) ×1 IMPLANT
ELECT BLADE 4.0 EZ CLEAN MEGAD (MISCELLANEOUS) ×2
ELECT REM PT RETURN 9FT ADLT (ELECTROSURGICAL) ×2
ELECTRODE BLDE 4.0 EZ CLN MEGD (MISCELLANEOUS) ×1 IMPLANT
ELECTRODE REM PT RTRN 9FT ADLT (ELECTROSURGICAL) ×1 IMPLANT
EVACUATOR 1/8 PVC DRAIN (DRAIN) IMPLANT
GLOVE BIO SURGEON STRL SZ8.5 (GLOVE) ×4 IMPLANT
GLOVE BIOGEL PI IND STRL 8.5 (GLOVE) ×1 IMPLANT
GLOVE BIOGEL PI INDICATOR 8.5 (GLOVE) ×1
GOWN STRL REUS W/ TWL LRG LVL3 (GOWN DISPOSABLE) ×2 IMPLANT
GOWN STRL REUS W/TWL 2XL LVL3 (GOWN DISPOSABLE) ×2 IMPLANT
GOWN STRL REUS W/TWL LRG LVL3 (GOWN DISPOSABLE) ×4
HANDPIECE INTERPULSE COAX TIP (DISPOSABLE) ×2
HOOD PEEL AWAY FACE SHEILD DIS (HOOD) ×4 IMPLANT
KIT BASIN OR (CUSTOM PROCEDURE TRAY) ×2 IMPLANT
KIT ROOM TURNOVER OR (KITS) ×2 IMPLANT
MANIFOLD NEPTUNE II (INSTRUMENTS) ×2 IMPLANT
MARKER SKIN DUAL TIP RULER LAB (MISCELLANEOUS) ×4 IMPLANT
NDL SPNL 18GX3.5 QUINCKE PK (NEEDLE) ×1 IMPLANT
NEEDLE SPNL 18GX3.5 QUINCKE PK (NEEDLE) ×2 IMPLANT
NS IRRIG 1000ML POUR BTL (IV SOLUTION) ×2 IMPLANT
PACK TOTAL JOINT (CUSTOM PROCEDURE TRAY) ×2 IMPLANT
PACK UNIVERSAL I (CUSTOM PROCEDURE TRAY) ×2 IMPLANT
PAD ARMBOARD 7.5X6 YLW CONV (MISCELLANEOUS) ×4 IMPLANT
SAW OSC TIP CART 19.5X105X1.3 (SAW) ×2 IMPLANT
SEALER BIPOLAR AQUA 6.0 (INSTRUMENTS) ×1 IMPLANT
SET HNDPC FAN SPRY TIP SCT (DISPOSABLE) ×1 IMPLANT
SOL PREP POV-IOD 4OZ 10% (MISCELLANEOUS) ×2 IMPLANT
SUT ETHIBOND NAB CT1 #1 30IN (SUTURE) ×4 IMPLANT
SUT MNCRL AB 3-0 PS2 18 (SUTURE) ×2 IMPLANT
SUT MON AB 2-0 CT1 36 (SUTURE) ×2 IMPLANT
SUT VIC AB 1 CT1 27 (SUTURE) ×2
SUT VIC AB 1 CT1 27XBRD ANBCTR (SUTURE) ×1 IMPLANT
SUT VIC AB 2-0 CT1 27 (SUTURE) ×4
SUT VIC AB 2-0 CT1 TAPERPNT 27 (SUTURE) ×1 IMPLANT
SUT VLOC 180 0 24IN GS25 (SUTURE) ×2 IMPLANT
SYR 50ML LL SCALE MARK (SYRINGE) ×2 IMPLANT
TOWEL OR 17X24 6PK STRL BLUE (TOWEL DISPOSABLE) ×2 IMPLANT
TOWEL OR 17X26 10 PK STRL BLUE (TOWEL DISPOSABLE) ×2 IMPLANT
TRAY CATH 16FR W/PLASTIC CATH (SET/KITS/TRAYS/PACK) IMPLANT
WATER STERILE IRR 1000ML POUR (IV SOLUTION) ×6 IMPLANT

## 2017-01-09 NOTE — Anesthesia Procedure Notes (Signed)
Spinal  Patient location during procedure: OR Start time: 01/09/2017 7:38 AM End time: 01/09/2017 7:42 AM Staffing Anesthesiologist: Renold Don E Performed: anesthesiologist  Preanesthetic Checklist Completed: patient identified, surgical consent, pre-op evaluation, timeout performed, IV checked, risks and benefits discussed and monitors and equipment checked Spinal Block Patient position: sitting Prep: DuraPrep Patient monitoring: heart rate, cardiac monitor, continuous pulse ox and blood pressure Approach: midline Location: L3-4 Injection technique: single-shot Needle Needle type: Pencan  Needle gauge: 24 G Additional Notes Functioning IV was confirmed and monitors were applied. Sterile prep and drape, including hand hygiene, mask, and sterile gloves were used. The patient was positioned and the spine was prepped. The skin was anesthetized with lidocaine. Free flow of clear CSF was obtained prior to injecting local anesthetic into the CSF. The spinal needle aspirated freely following injection. The needle was carefully withdrawn. The patient tolerated the procedure well. Consent was obtained prior to the procedure with all questions answered and concerns addressed. Risks including, but not limited to, bleeding, infection, nerve damage, paralysis, failed block, inadequate analgesia, allergic reaction, high spinal, itching, and headache were discussed and the patient wished to proceed.  Renold Don, MD

## 2017-01-09 NOTE — Op Note (Signed)
OPERATIVE REPORT  SURGEON: Rod Can, MD   ASSISTANT: Ky Barban, RNFA.  PREOPERATIVE DIAGNOSIS: Right hip arthritis.   POSTOPERATIVE DIAGNOSIS: Right hip arthritis.   PROCEDURE: Right total hip arthroplasty, anterior approach.   IMPLANTS: Biomet Taperloc Complete Microplasty stem, size 13 x 111, standard offset. Biomet G7 Cup, size 56 mm. Biomet E1 liner, size 36 mm, F, neutral. Biomet Biolox ceramic head ball, size 36 + -3 mm.  ANESTHESIA:  Spinal  ESTIMATED BLOOD LOSS: 400 mL.  ANTIBIOTICS: 2 g Ancef.  DRAINS: None.  COMPLICATIONS: None.   CONDITION: PACU - hemodynamically stable.   BRIEF CLINICAL NOTE: Alex Hunter is a 61 y.o. male with a long-standing history of Right hip arthritis. After failing conservative management, the patient was indicated for total hip arthroplasty. The risks, benefits, and alternatives to the procedure were explained, and the patient elected to proceed.  PROCEDURE IN DETAIL: Surgical site was marked by myself in the pre-op holding area. Once inside the operating room, spinal anesthesia was obtained, and a foley catheter was inserted. The patient was then positioned on the Hana table. All bony prominences were well padded. The hip was prepped and draped in the normal sterile surgical fashion. A time-out was called verifying side and site of surgery. The patient received IV antibiotics within 60 minutes of beginning the procedure.  The direct anterior approach to the hip was performed through the Hueter interval. Lateral femoral circumflex vessels were treated with the Auqumantys. The anterior capsule was exposed and an inverted T capsulotomy was made.The femoral neck cut was made to the level of the templated cut. A corkscrew was placed into the head and the head was removed. The femoral head was found to have eburnated bone. The head was passed to the back table and was measured.  Acetabular exposure was achieved, and the  pulvinar and labrum were excised. Sequential reaming of the acetabulum was then performed up to a size 55 mm reamer. A 56 mm cup was then opened and impacted into place at approximately 40 degrees of abduction and 20 degrees of anteversion. The final polyethylene liner was impacted into place and acetabular osteophytes were removed.   I then gained femoral exposure taking care to protect the abductors and greater trochanter. This was performed using standard external rotation, extension, and adduction. The capsule was peeled off the inner aspect of the greater trochanter, taking care to preserve the short external rotators. A cookie cutter was used to enter the femoral canal, and then the femoral canal finder was placed. Sequential broaching was performed up to a size 13. I placed a standard offset neck and a trial head ball. The hip was reduced. Leg lengths and offset were checked fluoroscopically. The hip was dislocated and trial components were removed. The final implants were placed, and the hip was reduced.  Fluoroscopy was used to confirm component position and leg lengths. At 90 degrees of external rotation and full extension, the hip was stable to an anterior directed force.  The wound was copiously irrigated with normal saline using pulse lavage. Marcaine solution was injected into the periarticular soft tissue. The wound was closed in layers using #1 Vicryl and V-Loc for the fascia, 2-0 Vicryl for the subcutaneous fat, 2-0 Monocryl for the deep dermal layer, 3-0 running Monocryl subcuticular stitch, and Dermabond for the skin. Once the glue was fully dried, an Aquacell Ag dressing was applied. The patient was transported to the recovery room in stable condition. Sponge, needle, and instrument counts  were correct at the end of the case x2. The patient tolerated the procedure well and there were no known complications.

## 2017-01-09 NOTE — Anesthesia Postprocedure Evaluation (Signed)
Anesthesia Post Note  Patient: Alex Hunter  Procedure(s) Performed: Procedure(s) (LRB): RIGHT TOTAL HIP ARTHROPLASTY ANTERIOR APPROACH (Right)     Patient location during evaluation: PACU Anesthesia Type: Spinal Level of consciousness: awake and alert Pain management: pain level controlled Vital Signs Assessment: post-procedure vital signs reviewed and stable Respiratory status: spontaneous breathing and respiratory function stable Cardiovascular status: blood pressure returned to baseline and stable Postop Assessment: spinal receding Anesthetic complications: no    Last Vitals:  Vitals:   01/09/17 1130 01/09/17 1145  BP: 131/85 132/80  Pulse: 68 70  Resp: 13 15  Temp:    SpO2: 100% 100%    Last Pain:  Vitals:   01/09/17 1045  TempSrc:   PainSc: 0-No pain                 Audry Pili

## 2017-01-09 NOTE — H&P (View-Only) (Signed)
TOTAL HIP ADMISSION H&P  Patient is admitted for right total hip arthroplasty.  Subjective:  Chief Complaint: right hip pain  HPI: Alex Hunter, 61 y.o. male, has a history of pain and functional disability in the right hip(s) due to arthritis and patient has failed non-surgical conservative treatments for greater than 12 weeks to include NSAID's and/or analgesics, flexibility and strengthening excercises, use of assistive devices, weight reduction as appropriate and activity modification.  Onset of symptoms was gradual starting >10 years ago with gradually worsening course since that time.The patient noted no past surgery on the right hip(s).  Patient currently rates pain in the right hip at 10 out of 10 with activity. Patient has night pain, worsening of pain with activity and weight bearing, pain that interfers with activities of daily living, pain with passive range of motion and crepitus. Patient has evidence of subchondral cysts, subchondral sclerosis, periarticular osteophytes and joint space narrowing by imaging studies. This condition presents safety issues increasing the risk of falls.  There is no current active infection.  Patient Active Problem List   Diagnosis Date Noted  . Essential hypertension 03/06/2012  . Perirectal abscess 03/06/2012  . Hyperlipidemia 11/19/2009   Past Medical History:  Diagnosis Date  . High blood pressure   . High cholesterol     Past Surgical History:  Procedure Laterality Date  . DENTAL SURGERY       (Not in a hospital admission) No Known Allergies  Social History  Substance Use Topics  . Smoking status: Former Smoker    Types: Cigarettes  . Smokeless tobacco: Never Used  . Alcohol use Yes     Comment: occ    Family History  Problem Relation Age of Onset  . Heart disease Mother   . Leukemia Brother   . Heart attack Brother      Review of Systems  Constitutional: Negative.   HENT: Negative.   Eyes: Negative.   Respiratory:  Negative.   Cardiovascular: Negative.   Gastrointestinal: Negative.   Genitourinary: Negative.   Musculoskeletal: Positive for joint pain.  Skin: Negative.   Neurological: Negative.   Endo/Heme/Allergies: Negative.   Psychiatric/Behavioral: Negative.     Objective:  Physical Exam  Vitals reviewed. Constitutional: He is oriented to person, place, and time. He appears well-developed and well-nourished.  HENT:  Head: Normocephalic and atraumatic.  Eyes: Pupils are equal, round, and reactive to light. Conjunctivae and EOM are normal.  Neck: Normal range of motion. Neck supple.  Cardiovascular: Normal rate, regular rhythm and intact distal pulses.   Respiratory: Effort normal. No respiratory distress.  GI: Soft. He exhibits no distension.  Genitourinary:  Genitourinary Comments: deferred  Musculoskeletal:       Right hip: He exhibits decreased range of motion and crepitus.  Neurological: He is alert and oriented to person, place, and time. He has normal reflexes.  Skin: Skin is warm and dry.  Psychiatric: He has a normal mood and affect. His behavior is normal. Judgment and thought content normal.    Vital signs in last 24 hours: @VSRANGES @  Labs:   Estimated body mass index is 36.31 kg/m as calculated from the following:   Height as of 12/08/16: 5\' 5"  (1.651 m).   Weight as of 12/08/16: 99 kg (218 lb 3.2 oz).   Imaging Review Plain radiographs demonstrate severe degenerative joint disease of the right hip(s). The bone quality appears to be adequate for age and reported activity level.  Assessment/Plan:  End stage arthritis, right hip(s)  The patient history, physical examination, clinical judgement of the provider and imaging studies are consistent with end stage degenerative joint disease of the right hip(s) and total hip arthroplasty is deemed medically necessary. The treatment options including medical management, injection therapy, arthroscopy and arthroplasty were  discussed at length. The risks and benefits of total hip arthroplasty were presented and reviewed. The risks due to aseptic loosening, infection, stiffness, dislocation/subluxation,  thromboembolic complications and other imponderables were discussed.  The patient acknowledged the explanation, agreed to proceed with the plan and consent was signed. Patient is being admitted for inpatient treatment for surgery, pain control, PT, OT, prophylactic antibiotics, VTE prophylaxis, progressive ambulation and ADL's and discharge planning.The patient is planning to be discharged home with HEP. Needs RW.

## 2017-01-09 NOTE — Interval H&P Note (Signed)
History and Physical Interval Note:  01/09/2017 7:33 AM  Alex Hunter  has presented today for surgery, with the diagnosis of Degenerative joint disease right hip  The various methods of treatment have been discussed with the patient and family. After consideration of risks, benefits and other options for treatment, the patient has consented to  Procedure(s): RIGHT TOTAL HIP ARTHROPLASTY ANTERIOR APPROACH (Right) as a surgical intervention .  The patient's history has been reviewed, patient examined, no change in status, stable for surgery.  I have reviewed the patient's chart and labs.  Questions were answered to the patient's satisfaction.     Saadiya Wilfong, Horald Pollen

## 2017-01-09 NOTE — Evaluation (Signed)
Physical Therapy Evaluation Patient Details Name: Alex Hunter MRN: [id number] DOB: 12/05/1955 Today's Date: 01/09/2017   History of Present Illness  Pt is a 61 y/o male s/p elective R THA, direct anterior approach. PMH includes HTN.   Clinical Impression  Pt s/p surgery above with deficits below. PTA, pt was independent with functional mobility. Upon eval, pt limited by post op weakness, and decreased balance. Pt requiring min guard assist for mobility. Pt reports grandchildren and daughter in law will be able to assist as needed upon d/c. DME recommendations below and follow up PT per MD arrangements. Will continue to follow acutely to maximize functional mobility independence and safety.     Follow Up Recommendations DC plan and follow up therapy as arranged by surgeon;Supervision for mobility/OOB    Equipment Recommendations  Rolling walker with 5" wheels;3in1 (PT)    Recommendations for Other Services       Precautions / Restrictions Precautions Precautions: None Precaution Comments: Reviewed supine THA ther ex with pt  Restrictions Weight Bearing Restrictions: Yes RLE Weight Bearing: Weight bearing as tolerated      Mobility  Bed Mobility Overal bed mobility: Needs Assistance Bed Mobility: Supine to Sit;Sit to Supine     Supine to sit: Supervision Sit to supine: Supervision   General bed mobility comments: Supervision for safety.   Transfers Overall transfer level: Needs assistance Equipment used: Rolling walker (2 wheeled) Transfers: Sit to/from Stand Sit to Stand: Supervision         General transfer comment: Supervision for safety. Demonstrated safe hand placement.   Ambulation/Gait Ambulation/Gait assistance: Min guard Ambulation Distance (Feet): 50 Feet Assistive device: Rolling walker (2 wheeled) Gait Pattern/deviations: Step-to pattern;Decreased step length - right;Decreased step length - left;Decreased weight shift to right;Antalgic Gait  velocity: Decreased Gait velocity interpretation: Below normal speed for age/gender General Gait Details: Slow, guarded gait. Verbal cues for sequencing with RW. Min guard for safety.   Stairs            Wheelchair Mobility    Modified Rankin (Stroke Patients Only)       Balance Overall balance assessment: Needs assistance Sitting-balance support: No upper extremity supported;Feet supported Sitting balance-Leahy Scale: Good     Standing balance support: Bilateral upper extremity supported;During functional activity Standing balance-Leahy Scale: Poor Standing balance comment: Reliant on UE support                             Pertinent Vitals/Pain Pain Assessment: No/denies pain    Home Living Family/patient expects to be discharged to:: Private residence Living Arrangements: Non-relatives/Friends Available Help at Discharge: Family;Available 24 hours/day Type of Home: House Home Access: Level entry     Home Layout: Two level Home Equipment: None      Prior Function Level of Independence: Independent               Hand Dominance   Dominant Hand: Right    Extremity/Trunk Assessment   Upper Extremity Assessment Upper Extremity Assessment: Defer to OT evaluation    Lower Extremity Assessment Lower Extremity Assessment: RLE deficits/detail RLE Deficits / Details: Sensory in tact. Deficits consistent with post op pain and weakness. Able to perform exercises below.     Cervical / Trunk Assessment Cervical / Trunk Assessment: Normal  Communication   Communication: No difficulties  Cognition Arousal/Alertness: Awake/alert Behavior During Therapy: WFL for tasks assessed/performed Overall Cognitive Status: Within Functional Limits for tasks assessed  General Comments      Exercises Total Joint Exercises Ankle Circles/Pumps: AROM;Both;20 reps Quad Sets: AROM;Right;10 reps Short  Arc Quad: AROM;Right;10 reps Heel Slides: AROM;Right;10 reps Hip ABduction/ADduction: AROM;Right;10 reps   Assessment/Plan    PT Assessment Patient needs continued PT services  PT Problem List Decreased strength;Decreased range of motion;Decreased balance;Decreased knowledge of use of DME;Decreased mobility;Decreased knowledge of precautions       PT Treatment Interventions DME instruction;Gait training;Stair training;Functional mobility training;Therapeutic activities;Therapeutic exercise;Balance training;Neuromuscular re-education;Patient/family education    PT Goals (Current goals can be found in the Care Plan section)  Acute Rehab PT Goals Patient Stated Goal: to go home  PT Goal Formulation: With patient Time For Goal Achievement: 01/16/17 Potential to Achieve Goals: Good    Frequency 7X/week   Barriers to discharge        Co-evaluation               AM-PAC PT "6 Clicks" Daily Activity  Outcome Measure Difficulty turning over in bed (including adjusting bedclothes, sheets and blankets)?: None Difficulty moving from lying on back to sitting on the side of the bed? : None Difficulty sitting down on and standing up from a chair with arms (e.g., wheelchair, bedside commode, etc,.)?: Unable Help needed moving to and from a bed to chair (including a wheelchair)?: A Little Help needed walking in hospital room?: A Little Help needed climbing 3-5 steps with a railing? : A Little 6 Click Score: 18    End of Session Equipment Utilized During Treatment: Gait belt Activity Tolerance: Patient tolerated treatment well Patient left: in chair;with call bell/phone within reach;with nursing/sitter in room Nurse Communication: Mobility status;Other (comment) (nausea ) PT Visit Diagnosis: Other abnormalities of gait and mobility (R26.89)    Time: 7622-6333 PT Time Calculation (min) (ACUTE ONLY): 23 min   Charges:   PT Evaluation $PT Eval Low Complexity: 1 Low PT  Treatments $Gait Training: 8-22 mins   PT G Codes:        Leighton Ruff, PT, DPT  Acute Rehabilitation Services  Pager: [phone]   Rudean Hitt 01/09/2017, 4:10 PM

## 2017-01-09 NOTE — Transfer of Care (Signed)
Immediate Anesthesia Transfer of Care Note  Patient: Alex Hunter  Procedure(s) Performed: Procedure(s): RIGHT TOTAL HIP ARTHROPLASTY ANTERIOR APPROACH (Right)  Patient Location: PACU  Anesthesia Type:MAC and Spinal  Level of Consciousness: responds to stimulation  Airway & Oxygen Therapy: Patient Spontanous Breathing and Patient connected to nasal cannula oxygen  Post-op Assessment: Report given to RN and Post -op Vital signs reviewed and stable  Post vital signs: Reviewed and stable  Last Vitals:  Vitals:   01/09/17 0540  BP: (!) 172/92  Pulse: 69  Resp: 18  Temp: 36.5 C  SpO2: 99%    Last Pain:  Vitals:   01/09/17 0540  TempSrc: Oral         Complications: No apparent anesthesia complications

## 2017-01-10 ENCOUNTER — Encounter (HOSPITAL_COMMUNITY): Payer: Self-pay | Admitting: Orthopedic Surgery

## 2017-01-10 LAB — BASIC METABOLIC PANEL
Anion gap: 5 (ref 5–15)
BUN: 13 mg/dL (ref 6–20)
CO2: 23 mmol/L (ref 22–32)
Calcium: 8.4 mg/dL — ABNORMAL LOW (ref 8.9–10.3)
Chloride: 108 mmol/L (ref 101–111)
Creatinine, Ser: 0.95 mg/dL (ref 0.61–1.24)
GFR calc Af Amer: >60 mL/min (ref >60)
GLUCOSE: 112 mg/dL — AB (ref 65–99)
Potassium: 4.4 mmol/L (ref 3.5–5.1)
Sodium: 136 mmol/L (ref 135–145)

## 2017-01-10 LAB — CBC
HCT: 37.6 % — ABNORMAL LOW (ref 39.0–52.0)
Hemoglobin: 12 g/dL — ABNORMAL LOW (ref 13.0–17.0)
MCH: 25.8 pg — AB (ref 26.0–34.0)
MCHC: 31.9 g/dL (ref 30.0–36.0)
MCV: 80.9 fL (ref 78.0–100.0)
PLATELETS: 221 10*3/uL (ref 150–400)
RBC: 4.65 MIL/uL (ref 4.22–5.81)
RDW: 15 % (ref 11.5–15.5)
WBC: 15.2 10*3/uL — ABNORMAL HIGH (ref 4.0–10.5)

## 2017-01-10 MED ORDER — ASPIRIN 81 MG PO CHEW: 81.0000 mg | CHEWABLE_TABLET | Freq: Two times a day (BID) | ORAL | 1 refills | Status: DC

## 2017-01-10 MED ORDER — SENNA 8.6 MG PO TABS: 2.0000 | ORAL_TABLET | Freq: Every day | ORAL | 0 refills | Status: DC

## 2017-01-10 MED ORDER — ONDANSETRON HCL 4 MG PO TABS: 4.0000 mg | ORAL_TABLET | Freq: Four times a day (QID) | ORAL | 0 refills | Status: DC | PRN

## 2017-01-10 MED ORDER — DOCUSATE SODIUM 100 MG PO CAPS: 100.0000 mg | ORAL_CAPSULE | Freq: Two times a day (BID) | ORAL | 1 refills | Status: DC

## 2017-01-10 MED ORDER — HYDROCODONE-ACETAMINOPHEN 5-325 MG PO TABS: 1.0000 | ORAL_TABLET | ORAL | 0 refills | Status: DC | PRN

## 2017-01-10 NOTE — Progress Notes (Signed)
Pt discharge instructions given to him and gone over with him. He verbalizes understandings of information. Is waiting on ride.

## 2017-01-10 NOTE — Discharge Summary (Signed)
Physician Discharge Summary  Patient ID: Alex Hunter MRN: [id number] DOB/AGE: Mar 25, 1956 61 y.o.  Admit date: 01/09/2017 Discharge date: 01/10/2017  Admission Diagnoses:  Osteoarthritis of right hip  Discharge Diagnoses:  Principal Problem:   Osteoarthritis of right hip   Past Medical History:  Diagnosis Date  . Arthritis    "right hip" (01/09/2017)  . High blood pressure   . High cholesterol     Surgeries: Procedure(s): RIGHT TOTAL HIP ARTHROPLASTY ANTERIOR APPROACH on 01/09/2017   Consultants (if any):   Discharged Condition: Improved  Hospital Course: Alex Hunter is an 61 y.o. male who was admitted 01/09/2017 with a diagnosis of Osteoarthritis of right hip and went to the operating room on 01/09/2017 and underwent the above named procedures.    He was given perioperative antibiotics:  Anti-infectives    Start     Dose/Rate Route Frequency Ordered Stop   01/09/17 1500  ceFAZolin (ANCEF) IVPB 2g/100 mL premix     2 g 200 mL/hr over 30 Minutes Intravenous Every 6 hours 01/09/17 1102 01/09/17 2112   01/09/17 0715  ceFAZolin (ANCEF) IVPB 2g/100 mL premix     2 g 200 mL/hr over 30 Minutes Intravenous On call to O.R. 01/08/17 1119 01/09/17 0825    .  He was given sequential compression devices, early ambulation, and ASA for DVT prophylaxis.  He benefited maximally from the hospital stay and there were no complications.    Recent vital signs:  Vitals:   01/09/17 1444 01/09/17 2100  BP: 108/77 131/72  Pulse: 70 77  Resp: 18   Temp: 98.5 F (36.9 C) 98.7 F (37.1 C)  SpO2: 97% 98%    Recent laboratory studies:  Lab Results  Component Value Date   HGB 12.0 (L) 01/10/2017   HGB 15.4 12/28/2016   HGB 15.5 12/08/2016   Lab Results  Component Value Date   WBC 15.2 (H) 01/10/2017   PLT 221 01/10/2017   Lab Results  Component Value Date   INR 1.0 12/08/2016   Lab Results  Component Value Date   NA 136 01/10/2017   K 4.4 01/10/2017   CL 108  01/10/2017   CO2 23 01/10/2017   BUN 13 01/10/2017   CREATININE 0.95 01/10/2017   GLUCOSE 112 (H) 01/10/2017    Discharge Medications:   Allergies as of 01/10/2017   No Known Allergies     Medication List    TAKE these medications   aspirin 81 MG chewable tablet Chew 1 tablet (81 mg total) by mouth 2 (two) times daily.   docusate sodium 100 MG capsule Commonly known as:  COLACE Take 1 capsule (100 mg total) by mouth 2 (two) times daily.   HYDROcodone-acetaminophen 5-325 MG tablet Commonly known as:  NORCO/VICODIN Take 1-2 tablets by mouth every 4 (four) hours as needed (breakthrough pain).   naproxen sodium 220 MG tablet Commonly known as:  ANAPROX Take 440 mg by mouth daily.   ondansetron 4 MG tablet Commonly known as:  ZOFRAN Take 1 tablet (4 mg total) by mouth every 6 (six) hours as needed for nausea.   senna 8.6 MG Tabs tablet Commonly known as:  SENOKOT Take 2 tablets (17.2 mg total) by mouth at bedtime.            Discharge Care Instructions        Start     Ordered   01/10/17 0000  aspirin 81 MG chewable tablet  2 times daily     01/10/17 0809  01/10/17 0000  docusate sodium (COLACE) 100 MG capsule  2 times daily     01/10/17 0809   01/10/17 0000  HYDROcodone-acetaminophen (NORCO/VICODIN) 5-325 MG tablet  Every 4 hours PRN     01/10/17 0809   01/10/17 0000  ondansetron (ZOFRAN) 4 MG tablet  Every 6 hours PRN     01/10/17 0809   01/10/17 0000  senna (SENOKOT) 8.6 MG TABS tablet  Daily at bedtime     01/10/17 0809   01/10/17 0000  Call MD / Call 911    Comments:  If you experience chest pain or shortness of breath, CALL 911 and be transported to the hospital emergency room.  If you develope a fever above 101 F, pus (white drainage) or increased drainage or redness at the wound, or calf pain, call your surgeon's office.   01/10/17 0809   01/10/17 0000  Diet - low sodium heart healthy     01/10/17 0809   01/10/17 0000  Constipation Prevention     Comments:  Drink plenty of fluids.  Prune juice may be helpful.  You may use a stool softener, such as Colace (over the counter) 100 mg twice a day.  Use MiraLax (over the counter) for constipation as needed.   01/10/17 0809   01/10/17 0000  Increase activity slowly as tolerated     01/10/17 0809   01/10/17 0000  Driving restrictions    Comments:  No driving for 6 weeks   01/10/17 0809   01/10/17 0000  Lifting restrictions    Comments:  No lifting for 6 weeks   01/10/17 0809   01/10/17 0000  TED hose    Comments:  Use stockings (TED hose) for 2 weeks on both leg(s).  You may remove them at night for sleeping.   01/10/17 0809      Diagnostic Studies: Dg Pelvis Portable  Result Date: 01/09/2017 CLINICAL DATA:  Status post total hip replacement right EXAM: PORTABLE PELVIS 1-2 VIEWS COMPARISON:  Intraoperative images obtained earlier in the day ; images of right hip November 02, 2015 FINDINGS: Frontal pelvis obtained. There is a total hip replacement right with prosthetic components well-seated. No fracture or dislocation. There is moderate narrowing of the left hip joint. IMPRESSION: Right total hip replacement prosthetic components appear well seated on frontal view. No acute fracture dislocation. Moderate osteoarthritic change left hip joint. Electronically Signed   By: Lowella Grip III M.D.   On: 01/09/2017 11:32   Dg C-arm 1-60 Min  Result Date: 01/09/2017 CLINICAL DATA:  Anterior approach right hip joint prosthesis placement. Fluoro time reported is 28 seconds EXAM: DG C-ARM 61-120 MIN; OPERATIVE RIGHT HIP WITH PELVIS COMPARISON:  Preoperative right hip series of November 02, 2015 FINDINGS: The patient has undergone right total hip joint prosthesis placement. Radiographic positioning of the prosthetic components is good. The interface with the native bone is normal. IMPRESSION: No immediate postprocedure complication following right total hip joint prosthesis placement. Electronically Signed    By: David  Martinique M.D.   On: 01/09/2017 10:10   Dg Hip Operative Unilat With Pelvis Right  Result Date: 01/09/2017 CLINICAL DATA:  Anterior approach right hip joint prosthesis placement. Fluoro time reported is 28 seconds EXAM: DG C-ARM 61-120 MIN; OPERATIVE RIGHT HIP WITH PELVIS COMPARISON:  Preoperative right hip series of November 02, 2015 FINDINGS: The patient has undergone right total hip joint prosthesis placement. Radiographic positioning of the prosthetic components is good. The interface with the native bone is normal. IMPRESSION:  No immediate postprocedure complication following right total hip joint prosthesis placement. Electronically Signed   By: David  Martinique M.D.   On: 01/09/2017 10:10    Disposition: 01-Home or Self Care  Discharge Instructions    Call MD / Call 911    Complete by:  As directed    If you experience chest pain or shortness of breath, CALL 911 and be transported to the hospital emergency room.  If you develope a fever above 101 F, pus (white drainage) or increased drainage or redness at the wound, or calf pain, call your surgeon's office.   Constipation Prevention    Complete by:  As directed    Drink plenty of fluids.  Prune juice may be helpful.  You may use a stool softener, such as Colace (over the counter) 100 mg twice a day.  Use MiraLax (over the counter) for constipation as needed.   Diet - low sodium heart healthy    Complete by:  As directed    Driving restrictions    Complete by:  As directed    No driving for 6 weeks   Increase activity slowly as tolerated    Complete by:  As directed    Lifting restrictions    Complete by:  As directed    No lifting for 6 weeks   TED hose    Complete by:  As directed    Use stockings (TED hose) for 2 weeks on both leg(s).  You may remove them at night for sleeping.      Follow-up Information    Keiston Manley, Aaron Edelman, MD. Schedule an appointment as soon as possible for a visit in 2 week(s).   Specialty:  Orthopedic  Surgery Why:  For wound re-check Contact information: [address] [place] [address] [phone]        [facility] Follow up.   Specialty:  [place] Why:  [facility] PT   Contact information: [address] [place] [address] [phone]            Signed: Elie Goody 01/10/2017, 7:19 PM

## 2017-01-10 NOTE — Progress Notes (Signed)
Physical Therapy Treatment Patient Details Name: Alex Hunter MRN: [id number] DOB: 11-17-1955 Today's Date: 01/10/2017    History of Present Illness Pt is a 61 y/o male s/p elective R THA, direct anterior approach. PMH includes HTN.     PT Comments    Pt more mobile this am but required cues for safety throughout session.  Pt educated on signs of too much and keeping his body positioned in his RW.  Pt is more confident than he should be.  PTA educated and reviewed entire HEP for home use.  Pt educated on frequency and verbalized understanding.  Will inform nursing patient is ready to d/c home.     Follow Up Recommendations  DC plan and follow up therapy as arranged by surgeon;Supervision for mobility/OOB     Equipment Recommendations  Rolling walker with 5" wheels;3in1 (PT)    Recommendations for Other Services       Precautions / Restrictions Precautions Precautions: None Precaution Comments: Reviewed completed THA ther ex with pt including supine, seated and standing exercises.   Restrictions Weight Bearing Restrictions: Yes RLE Weight Bearing: Weight bearing as tolerated    Mobility  Bed Mobility               General bed mobility comments: Pt standing in bathroom without RW on arrival limping around his room.    Transfers Overall transfer level: Modified independent Equipment used: None             General transfer comment: Pt educated to use RW once in standing and performed stand to sit with RW with cues for hand placement to reach for seated surface.  RW safety emphasized.    Ambulation/Gait Ambulation/Gait assistance: Supervision Ambulation Distance (Feet): 750 Feet Assistive device: Rolling walker (2 wheeled) Gait Pattern/deviations: Step-through pattern;Decreased stance time - right;Antalgic Gait velocity: Decreased Gait velocity interpretation: Below normal speed for age/gender General Gait Details: Cues for gait symmetry and weight shifting  R to avoid antalgic pattern.  Pt with poor safety turning outside RW and educated to keep his body position within RW to improve safety.     Stairs Stairs: Yes  Supervision Stair Management: One rail Left Number of Stairs: 12 General stair comments: Cues for sequencing and use of hand rail, supervision for safety with technique.    Wheelchair Mobility    Modified Rankin (Stroke Patients Only)       Balance Overall balance assessment: Needs assistance   Sitting balance-Leahy Scale: Normal       Standing balance-Leahy Scale: Good                              Cognition Arousal/Alertness: Awake/alert Behavior During Therapy: WFL for tasks assessed/performed Overall Cognitive Status: Within Functional Limits for tasks assessed                                        Exercises Total Joint Exercises Ankle Circles/Pumps: AROM;Both;20 reps Quad Sets: AROM;Right;10 reps;Supine Short Arc Quad: AROM;Right;10 reps;Supine Heel Slides: AROM;Right;10 reps;Supine Hip ABduction/ADduction: AROM;Right;20 reps;Supine;Standing (1x10 supine and 1x10 in standing.  ) Long Arc Quad: AROM;Right;10 reps;Seated Knee Flexion: AROM;Right;10 reps;Standing Marching in Standing: AROM;Right;10 reps;Standing Standing Hip Extension: AROM;Right;10 reps;Standing    General Comments        Pertinent Vitals/Pain Pain Assessment: 0-10 Pain Score: 2  Pain Descriptors / Indicators: Sore  Pain Intervention(s): Monitored during session;Repositioned    Home Living                      Prior Function            PT Goals (current goals can now be found in the care plan section) Acute Rehab PT Goals Patient Stated Goal: to go home  Potential to Achieve Goals: Good Progress towards PT goals: Progressing toward goals    Frequency           PT Plan Current plan remains appropriate    Co-evaluation              AM-PAC PT "6 Clicks" Daily Activity   Outcome Measure  Difficulty turning over in bed (including adjusting bedclothes, sheets and blankets)?: None Difficulty moving from lying on back to sitting on the side of the bed? : None Difficulty sitting down on and standing up from a chair with arms (e.g., wheelchair, bedside commode, etc,.)?: None Help needed moving to and from a bed to chair (including a wheelchair)?: A Little Help needed walking in hospital room?: A Little Help needed climbing 3-5 steps with a railing? : A Little 6 Click Score: 21    End of Session Equipment Utilized During Treatment: Gait belt Activity Tolerance: Patient tolerated treatment well Patient left: in chair;with call bell/phone within reach;with nursing/sitter in room Nurse Communication: Mobility status PT Visit Diagnosis: Other abnormalities of gait and mobility (R26.89)     Time: 3559-7416 PT Time Calculation (min) (ACUTE ONLY): 29 min  Charges:  $Gait Training: 8-22 mins $Therapeutic Exercise: 8-22 mins                    G Codes:       Governor Rooks, PTA pager [phone]    Cristela Blue 01/10/2017, 10:29 AM

## 2017-01-10 NOTE — Progress Notes (Signed)
   Subjective:  Patient reports pain as mild to moderate.  Denies N/V/CP/SOB. Wants to go home.  Objective:   VITALS:   Vitals:   01/09/17 1400 01/09/17 1415 01/09/17 1444 01/09/17 2100  BP:   108/77 131/72  Pulse: 70 73 70 77  Resp: 15 17 18    Temp:  98 F (36.7 C) 98.5 F (36.9 C) 98.7 F (37.1 C)  TempSrc:   Oral Oral  SpO2: 96% 97% 97% 98%  Weight:       NAD ABD soft Sensation intact distally Intact pulses distally Dorsiflexion/Plantar flexion intact Incision: dressing C/D/I Compartment soft   Lab Results  Component Value Date   WBC 6.1 12/28/2016   HGB 15.4 12/28/2016   HCT 47.5 12/28/2016   MCV 82.0 12/28/2016   PLT 257 12/28/2016   BMET    Component Value Date/Time   NA 136 01/10/2017 0552   NA 143 12/08/2016 1739   K 4.4 01/10/2017 0552   CL 108 01/10/2017 0552   CO2 23 01/10/2017 0552   GLUCOSE 112 (H) 01/10/2017 0552   BUN 13 01/10/2017 0552   BUN 11 12/08/2016 1739   CREATININE 0.95 01/10/2017 0552   CREATININE 1.07 06/10/2015 1133   CALCIUM 8.4 (L) 01/10/2017 0552   GFRNONAA >60 01/10/2017 0552   GFRAA >60 01/10/2017 0552     Assessment/Plan: 1 Day Post-Op   Principal Problem:   Osteoarthritis of right hip   WBAT with walker DVT ppx: ASA, SCDs, TEDs PO pain control PT/OT Dispo: D/C home today with HHPT   Redell Nazir, Horald Pollen 01/10/2017, 8:07 AM   Rod Can, MD Cell [phone]

## 2017-01-10 NOTE — Discharge Instructions (Addendum)
Partial Hip Replacement A hip replacement is a surgical procedure to remove damaged bone in your hip joint and replace it with an artificial hip joint (prosthetic hip joint). The purpose of this surgery is to reduce pain and improve your hip function. A partial hip replacement most often refers to a surgery to replace only part of the joint. The healthy parts of the hip are left intact. The new part of the hip is made of materials that allow a normal movement of the joint. Tell a health care provider about:  Any allergies you have.  All medicines you are taking, including vitamins, herbs, eye drops, creams, and over-the-counter medicines.  Any problems you or family members have had with anesthetic medicines.  Any blood disorders you have.  Any surgeries you have had.  Any medical conditions you have. What are the risks? Generally, this is a safe procedure. However, problems can occur and include:  Reaction to anesthetics.  Damage to surrounding nerves, tissues, or structures.  Infection.  Blood clot.  Bleeding.  Scarring.  Prosthesis not staying attached to the bone.  Joint dislocation.  Continued stiffness or loss of mobility.  Continued pain.  What happens before the procedure?  You may have an exam or tests.  Ask your health care provider about: ? Changing or stopping your regular medicines. This is especially important if you are taking diabetes medicines or blood thinners. ? Taking medicines such as aspirin and ibuprofen. These medicines can thin your blood. Do not take these medicines before your procedure if your health care provider asks you not to.  Do not eat or drink anything after midnight on the night before the procedure or as directed by your health care provider.  If directed by your health care provider, bathe with an antibacterial soap the night before the surgery or the morning of the surgery.  Do not use any tobacco products, including  cigarettes, chewing tobacco, or electronic cigarettes. If you need help quitting, ask your health care provider. Tobacco and nicotine products can delay healing after your surgery.  Have elective dental care and routine cleanings before your surgery.  Plan to have someone take you home after the procedure. Also arrange for someone to help you with activities during recovery.  Ask your health care provider about how your surgical site will be marked or identified.  You may be given antibiotic medicines to help prevent infection. What happens during the procedure?  To reduce your risk of infection: ? Your health care team will wash or sanitize their hands. ? Your skin will be washed with soap.  You will be given one of the following: ? A medicine that makes you fall asleep (general anesthetic). ? A medicine injected into your spine that numbs your body below the waist (spinal anesthetic).  The surgeon will make a cut (incision) in the hip to access the joint.  The top of the thighbone that contains the ball will be removed.  The surgeon will secure a new prosthetic ball joint into the healthy socket. The nearby bone may grow into the prosthesis to hold it in place, or cement may be used.  The incision will be closed with stitches (sutures). What happens after the procedure?  You will be taken to a recovery area.  Your blood pressure, heart rate, breathing rate, and blood oxygen level will be monitored often until the medicines you were given have worn off.  Once you are awake and stable you will be taken  to a hospital room.  You will receive medicines to help manage pain and swelling.  You will be asked to perform breathing exercises.  You may be directed to take actions to help prevent blood clots. These may include: ? Walking soon after surgery, with someone assisting you. Moving around after surgery helps to improve blood flow. ? Taking medicines to thin your blood  (anticoagulants). ? Wearing compression stockings or using different types of devices.  You will receive physical therapy until you are doing well and your health care provider feels it is safe for you to go home. This information is not intended to replace advice given to you by your health care provider. Make sure you discuss any questions you have with your health care provider. Document Released: 10/06/2009 Document Revised: 09/24/2015 Document Reviewed: 08/20/2013 Elsevier Interactive Patient Education  2017 [place]  Dr. Rod Can Joint Replacement Specialist [address] [place], [place] [address] [phone]   TOTAL HIP REPLACEMENT POSTOPERATIVE DIRECTIONS    Hip Rehabilitation, Guidelines Following Surgery   WEIGHT BEARING Weight bearing as tolerated with assist device (walker, cane, etc) as directed, use it as long as suggested by your surgeon or therapist, typically at least 4-6 weeks.  The results of a hip operation are greatly improved after range of motion and muscle strengthening exercises. Follow all safety measures which are given to protect your hip. If any of these exercises cause increased pain or swelling in your joint, decrease the amount until you are comfortable again. Then slowly increase the exercises. Call your caregiver if you have problems or questions.   HOME CARE INSTRUCTIONS  Most of the following instructions are designed to prevent the dislocation of your new hip.  Remove items at home which could result in a fall. This includes throw rugs or furniture in walking pathways.  Continue medications as instructed at time of discharge.  You may have some home medications which will be placed on hold until you complete the course of blood thinner medication.  You may start showering once you are discharged home. Do not remove your dressing. Do not put on socks or shoes without following the instructions of  your caregivers.   Sit on chairs with arms. Use the chair arms to help push yourself up when arising.  Arrange for the use of a toilet seat elevator so you are not sitting low.   Walk with walker as instructed.  You may resume a sexual relationship in one month or when given the OK by your caregiver.  Use walker as long as suggested by your caregivers.  You may put full weight on your legs and walk as much as is comfortable. Avoid periods of inactivity such as sitting longer than an hour when not asleep. This helps prevent blood clots.  You may return to work once you are cleared by [employer], production.  Do not drive a car for 6 weeks or until released by your surgeon.  Do not drive while taking narcotics.  Wear elastic stockings for two weeks following surgery during the day but you may remove then at night.  Make sure you keep all of your appointments after your operation with all of your doctors and caregivers. You should call the office at the above phone number and make an appointment for approximately two weeks after the date of your surgery. Please pick up a stool softener and laxative for home use as long as you are requiring pain medications.  ICE to the affected hip every three hours for 30 minutes at a time and then as needed for pain and swelling. Continue to use ice on the hip for pain and swelling from surgery. You may notice swelling that will progress down to the foot and ankle.  This is normal after surgery.  Elevate the leg when you are not up walking on it.   It is important for you to complete the blood thinner medication as prescribed by your doctor.  Continue to use the breathing machine which will help keep your temperature down.  It is common for your temperature to cycle up and down following surgery, especially at night when you are not up moving around and exerting yourself.  The breathing machine keeps your lungs expanded and your temperature down.  RANGE OF MOTION AND  STRENGTHENING EXERCISES  These exercises are designed to help you keep full movement of your hip joint. Follow your caregiver's or physical therapist's instructions. Perform all exercises about fifteen times, three times per day or as directed. Exercise both hips, even if you have had only one joint replacement. These exercises can be done on a training (exercise) mat, on the floor, on a table or on a bed. Use whatever works the best and is most comfortable for you. Use music or television while you are exercising so that the exercises are a pleasant break in your day. This will make your life better with the exercises acting as a break in routine you can look forward to.  Lying on your back, slowly slide your foot toward your buttocks, raising your knee up off the floor. Then slowly slide your foot back down until your leg is straight again.  Lying on your back spread your legs as far apart as you can without causing discomfort.  Lying on your side, raise your upper leg and foot straight up from the floor as far as is comfortable. Slowly lower the leg and repeat.  Lying on your back, tighten up the muscle in the front of your thigh (quadriceps muscles). You can do this by keeping your leg straight and trying to raise your heel off the floor. This helps strengthen the largest muscle supporting your knee.  Lying on your back, tighten up the muscles of your buttocks both with the legs straight and with the knee bent at a comfortable angle while keeping your heel on the floor.   SKILLED REHAB INSTRUCTIONS: If the patient is transferred to a skilled rehab facility following release from the hospital, a list of the current medications will be sent to the facility for the patient to continue.  When discharged from the skilled rehab facility, please have the facility set up the patient's [place] prior to being released. Also, the skilled facility will be responsible for providing the patient  with their medications at time of release from the facility to include their pain medication and their blood thinner medication. If the patient is still at the rehab facility at time of the two week follow up appointment, the skilled rehab facility will also need to assist the patient in arranging follow up appointment in our office and any transportation needs.  MAKE SURE YOU:  Understand these instructions.  Will watch your condition.  Will get help right away if you are not doing well or get worse.  Pick up stool softner and laxative for home use following surgery while on pain medications. Do not remove your dressing. The dressing  is waterproof--it is OK to take showers. Continue to use ice for pain and swelling after surgery. Do not use any lotions or creams on the incision until instructed by your surgeon. Total Hip Protocol.

## 2017-01-10 NOTE — Care Management Note (Addendum)
Case Management Note  Patient Details  Name: MATTOX SCHORR MRN: [id number] Date of Birth: October 04, 1955  Subjective/Objective:                 Spoke with patient at the bedside. He states he has DME at home. He would like to use [facility] for [facility] PT. Referral made to [facility] clinical liaison. No other CM needs identified.  Adendum- Notified patient pre operatively set up w [facility], patient agreeable to use [facility], notified [facility] of mistake. Will use [facility] at DC.   Action/Plan:  DC to home w [place].  Expected Discharge Date:  01/10/17               Expected Discharge Plan:  [place]  In-House Referral:     Discharge planning Services  CM Consult  Post Acute Care Choice:  Home Health Choice offered to:  Patient  DME Arranged:    DME Agency:     HH Arranged:  PT [place]:  [facility]  Status of Service:  Completed, signed off  If discussed at [place] of Stay Meetings, dates discussed:    Additional Comments:  Carles Collet, RN 01/10/2017, 10:52 AM

## 2017-04-03 ENCOUNTER — Ambulatory Visit (INDEPENDENT_AMBULATORY_CARE_PROVIDER_SITE_OTHER): Payer: BLUE CROSS/BLUE SHIELD

## 2017-04-03 ENCOUNTER — Encounter (INDEPENDENT_AMBULATORY_CARE_PROVIDER_SITE_OTHER): Payer: Self-pay | Admitting: Orthopaedic Surgery

## 2017-04-03 ENCOUNTER — Ambulatory Visit (INDEPENDENT_AMBULATORY_CARE_PROVIDER_SITE_OTHER): Payer: BLUE CROSS/BLUE SHIELD | Admitting: Orthopaedic Surgery

## 2017-04-03 DIAGNOSIS — Z96641 Presence of right artificial hip joint: Secondary | ICD-10-CM | POA: Diagnosis not present

## 2017-04-03 NOTE — Progress Notes (Signed)
Office Visit Note   Patient: Alex Hunter           Date of Birth: Jan 30, 1956           MRN: [id number] Visit Date: 04/03/2017              Requested by: Jaynee Eagles, PA-C [place], [place] [address] PCP: Jaynee Eagles, PA-C   Assessment & Plan: Visit Diagnoses:  1. History of right hip replacement     Plan: I did tell him that I do feel based on his clinical exam and x-ray findings that he is definitely shoulder on his left nonoperative leg in his right side.  When you see him stand and walk this does affect his gait and I do not feel that this will change at all with time.  I do feel that he needs either an insert for his left leg or a shoe buildup to help balance his gait and I gave him prescriptions for [employer] to consider this.  All questions concerns were answered and addressed.  He will follow-up with his operative surgeon at his regular scheduled follow-up.  Follow-Up Instructions: Return if symptoms worsen or fail to improve.   Orders:  Orders Placed This Encounter  Procedures  . XR HIP UNILAT W OR W/O PELVIS 1V RIGHT   No orders of the defined types were placed in this encounter.     Procedures: No procedures performed   Clinical Data: No additional findings.   Subjective: No chief complaint on file. The patient is a very pleasant 61 year old gentleman who appears much younger than his stated age.  He is just over 2 months out from a right total hip arthroplasty through direct anterior approach that was done by another surgeon in town.  He is coming to me for second opinion mainly to assess his leg lengths because he does feel that there is a leg length discrepancy.  He says that he was told that it was minimal.  He was hoping I could review the operative and postoperative films and obtain films today as well as assess his leg lengths for discrepancy.  He says overall he is doing well in the his pain is certainly improved and what it was preoperative.   He is very active individual and is back to most of his activities now but he is concerned about his leg length discrepancy in terms of just how it affects his gait.  He denies any left hip pain at all.  Denies any back issues.  HPI  Review of Systems He currently denies any headache, chest pain, shortness of breath, fever, chills, nausea, vomiting.  Objective: Vital Signs: There were no vitals taken for this visit.  Physical Exam He is alert and oriented x3 and in no acute distress he is walking with a slight limp.  He is not using an assistive device. Ortho Exam When I have him stand with his hips at the same position I stressed his back first and I see no scoliosis.  He cannot put his left foot down as flat as his right side.  I then had him lay supine with his pelvis knees and ankles at the same level and he does have a leg length discrepancy with his right operative side longer than his left side by more than a few millimeters. Specialty Comments:  No specialty comments available.  Imaging: Xr Hip Unilat W Or W/o Pelvis 1v Right  Result Date: 04/03/2017 An AP  pelvis and a lateral of his right operative hip shows a well-seated total hip arthroplasty on the right side with no complicating features.  There is a leg length discrepancy with the right operative leg being longer than the left nonoperative leg and these are standing films and the leg length difference can be measured.  I was able to review his intraoperative and immediate postoperative films and by all my measurements including looking at today's films he does have a leg length discrepancy with the right longer than left.  PMFS History: Patient Active Problem List   Diagnosis Date Noted  . Osteoarthritis of right hip 01/09/2017  . Essential hypertension 03/06/2012  . Perirectal abscess 03/06/2012  . Hyperlipidemia 11/19/2009   Past Medical History:  Diagnosis Date  . Arthritis    "right hip" (01/09/2017)  . High blood  pressure   . High cholesterol     Family History  Problem Relation Age of Onset  . Heart disease Mother   . Leukemia Brother   . Heart attack Brother     Past Surgical History:  Procedure Laterality Date  . DENTAL SURGERY     "had an implant" (01/09/2017)  . JOINT REPLACEMENT    . TOTAL HIP ARTHROPLASTY Right 01/09/2017  . TOTAL HIP ARTHROPLASTY Right 01/09/2017   Procedure: RIGHT TOTAL HIP ARTHROPLASTY ANTERIOR APPROACH;  Surgeon: Rod Can, MD;  Location: [facility];  Service: Orthopedics;  Laterality: Right;   Social History   Occupational History  . Not on file  Tobacco Use  . Smoking status: Former Smoker    Packs/day: 0.50    Years: 10.00    Pack years: 5.00    Types: Cigarettes  . Smokeless tobacco: Never Used  . Tobacco comment: "quit smoking in the 1980s"  Substance and Sexual Activity  . Alcohol use: Yes    Comment: 01/09/2017 "once/year"  . Drug use: Yes    Types: Cocaine    Comment: "went thru treatment back in the 1980s"  . Sexual activity: Yes

## 2017-05-04 ENCOUNTER — Ambulatory Visit: Payer: BLUE CROSS/BLUE SHIELD | Admitting: Urgent Care

## 2017-05-04 ENCOUNTER — Encounter: Payer: Self-pay | Admitting: Urgent Care

## 2017-05-04 VITALS — BP 157/90 | HR 67 | Temp 97.9°F | Resp 18 | Ht 65.0 in | Wt 224.2 lb

## 2017-05-04 DIAGNOSIS — R030 Elevated blood-pressure reading, without diagnosis of hypertension: Secondary | ICD-10-CM | POA: Diagnosis not present

## 2017-05-04 DIAGNOSIS — E669 Obesity, unspecified: Secondary | ICD-10-CM

## 2017-05-04 DIAGNOSIS — I10 Essential (primary) hypertension: Secondary | ICD-10-CM | POA: Diagnosis not present

## 2017-05-04 DIAGNOSIS — Z6837 Body mass index (BMI) 37.0-37.9, adult: Secondary | ICD-10-CM | POA: Diagnosis not present

## 2017-05-04 DIAGNOSIS — R7303 Prediabetes: Secondary | ICD-10-CM | POA: Diagnosis not present

## 2017-05-04 DIAGNOSIS — R42 Dizziness and giddiness: Secondary | ICD-10-CM | POA: Diagnosis not present

## 2017-05-04 LAB — POCT CBC
GRANULOCYTE PERCENT: 52.8 % (ref 37–80)
HEMATOCRIT: 45.2 % (ref 43.5–53.7)
Hemoglobin: 14.8 g/dL (ref 14.1–18.1)
Lymph, poc: 2.3 (ref 0.6–3.4)
MCH: 25.8 pg — AB (ref 27–31.2)
MCHC: 32.8 g/dL (ref 31.8–35.4)
MCV: 78.8 fL — AB (ref 80–97)
MID (CBC): 0.3 (ref 0–0.9)
MPV: 7.8 fL (ref 0–99.8)
PLATELET COUNT, POC: 257 10*3/uL (ref 142–424)
POC Granulocyte: 3 (ref 2–6.9)
POC LYMPH PERCENT: 41.9 %L (ref 10–50)
POC MID %: 5.3 % (ref 0–12)
RBC: 5.74 M/uL (ref 4.69–6.13)
RDW, POC: 16.6 %
WBC: 5.6 10*3/uL (ref 4.6–10.2)

## 2017-05-04 LAB — POCT URINALYSIS DIP (MANUAL ENTRY)
BILIRUBIN UA: NEGATIVE mg/dL
Bilirubin, UA: NEGATIVE
Glucose, UA: NEGATIVE mg/dL
LEUKOCYTES UA: NEGATIVE
NITRITE UA: NEGATIVE
PH UA: 6 (ref 5.0–8.0)
PROTEIN UA: NEGATIVE mg/dL
Spec Grav, UA: <=1.005 — AB (ref 1.010–1.025)
UROBILINOGEN UA: 0.2 U/dL

## 2017-05-04 LAB — POCT GLYCOSYLATED HEMOGLOBIN (HGB A1C): HEMOGLOBIN A1C: 6.3

## 2017-05-04 MED ORDER — LOSARTAN POTASSIUM 50 MG PO TABS: 50.0000 mg | ORAL_TABLET | Freq: Every day | ORAL | 3 refills | Status: DC

## 2017-05-04 MED ORDER — AMLODIPINE BESYLATE 5 MG PO TABS: 5.0000 mg | ORAL_TABLET | Freq: Every day | ORAL | 3 refills | Status: DC

## 2017-05-04 NOTE — Progress Notes (Signed)
MRN: [id number] DOB: [birth year]  Subjective:   Alex Hunter is a 62 y.o. male presenting for 1 day history of intermittent dizziness, difficulty with his balance. He tried drinking 2 bottles of water this morning and had some improvement. Still has dizziness when he bends down, crouches. Has also had right-sided temporal headache. Denies chronic headaches, tinnitus, chest pain, shortness of breath, heart racing, palpitations, nausea, vomiting, abdominal pain, hematuria, lower leg swelling. Denies smoking cigarettes. Does not hydrate well. Drinks sodas instead. Eats at restaurants every day.   [name] is not currently taking any medications and has No Known Allergies.  [name]  has a past medical history of Arthritis, High blood pressure, and High cholesterol. Also  has a past surgical history that includes Dental surgery; Joint replacement; Total hip arthroplasty (Right, 01/09/2017); and Total hip arthroplasty (Right, 01/09/2017).  Objective:   Vitals: BP (!) 157/90   Pulse 67   Temp 97.9 F (36.6 C) (Oral)   Resp 18   Ht 5\' 5"  (1.651 m)   Wt 224 lb 3.2 oz (101.7 kg)   SpO2 99%   BMI 37.31 kg/m   Orthostatic VS for the past 24 hrs:  BP- Lying Pulse- Lying BP- Sitting Pulse- Sitting BP- Standing at 0 minutes Pulse- Standing at 0 minutes  05/04/17 1018 (!) 153/97 78 (!) 151/93 70 (!) 159/91 78   BP Readings from Last 3 Encounters:  05/04/17 (!) 157/90  01/09/17 131/72  12/28/16 (!) 148/82   Physical Exam  Constitutional: He is oriented to person, place, and time. He appears well-developed and well-nourished.  HENT:  Mouth/Throat: Oropharynx is clear and moist.  Eyes: EOM are normal. Pupils are equal, round, and reactive to light. No scleral icterus.  Cardiovascular: Normal rate, regular rhythm and intact distal pulses. Exam reveals no gallop and no friction rub.  No murmur heard. Pulmonary/Chest: No respiratory distress. He has no wheezes. He has no rales.  Abdominal: Soft.  Bowel sounds are normal. He exhibits no distension and no mass. There is no tenderness. There is no guarding.  Musculoskeletal: He exhibits no edema.  Neurological: He is alert and oriented to person, place, and time. He displays normal reflexes. Coordination normal.  Skin: Skin is warm and dry. Capillary refill takes less than 2 seconds.  Psychiatric: He has a normal mood and affect.   Results for orders placed or performed in visit on 05/04/17 (from the past 24 hour(s))  POCT urinalysis dipstick     Status: Abnormal   Collection Time: 05/04/17 10:29 AM  Result Value Ref Range   Color, UA yellow yellow   Clarity, UA clear clear   Glucose, UA negative negative mg/dL   Bilirubin, UA negative negative   Ketones, POC UA negative negative mg/dL   Spec Grav, UA <=1.005 (A) 1.010 - 1.025   Blood, UA trace-intact (A) negative   pH, UA 6.0 5.0 - 8.0   Protein Ur, POC negative negative mg/dL   Urobilinogen, UA 0.2 0.2 or 1.0 E.U./dL   Nitrite, UA Negative Negative   Leukocytes, UA Negative Negative  POCT CBC     Status: Abnormal   Collection Time: 05/04/17 10:33 AM  Result Value Ref Range   WBC 5.6 4.6 - 10.2 K/uL   Lymph, poc 2.3 0.6 - 3.4   POC LYMPH PERCENT 41.9 10 - 50 %L   MID (cbc) 0.3 0 - 0.9   POC MID % 5.3 0 - 12 %M   POC Granulocyte 3.0 2 - 6.9  Granulocyte percent 52.8 37 - 80 %G   RBC 5.74 4.69 - 6.13 M/uL   Hemoglobin 14.8 14.1 - 18.1 g/dL   HCT, POC 45.2 43.5 - 53.7 %   MCV 78.8 (A) 80 - 97 fL   MCH, POC 25.8 (A) 27 - 31.2 pg   MCHC 32.8 31.8 - 35.4 g/dL   RDW, POC 16.6 %   Platelet Count, POC 257 142 - 424 K/uL   MPV 7.8 0 - 99.8 fL  POCT glycosylated hemoglobin (Hb A1C)     Status: None   Collection Time: 05/04/17 10:38 AM  Result Value Ref Range   Hemoglobin A1C 6.3    Assessment and Plan :   Essential hypertension  Elevated blood pressure reading  Dizziness - Plan: Orthostatic vital signs, Comprehensive metabolic panel, Lipid panel, POCT glycosylated  hemoglobin (Hb A1C), POCT urinalysis dipstick, POCT CBC, CANCELED: CBC  Class 2 obesity without serious comorbidity with body mass index (BMI) of 37.0 to 37.9 in adult, unspecified obesity type  Pre-diabetes  Labs pending, will manage his dizziness as HTN. Counseled on lifestyle modifications. Return-to-clinic precautions discussed, patient verbalized understanding. Otherwise, f/u in 4 weeks.  Jaynee Eagles, PA-C [facility] [phone] 05/04/2017  10:26 AM

## 2017-05-04 NOTE — Patient Instructions (Addendum)
Hypertension Hypertension, commonly called high blood pressure, is when the force of blood pumping through the arteries is too strong. The arteries are the blood vessels that carry blood from the heart throughout the body. Hypertension forces the heart to work harder to pump blood and may cause arteries to become narrow or stiff. Having untreated or uncontrolled hypertension can cause heart attacks, strokes, kidney disease, and other problems. A blood pressure reading consists of a higher number over a lower number. Ideally, your blood pressure should be below 120/80. The first ("top") number is called the systolic pressure. It is a measure of the pressure in your arteries as your heart beats. The second ("bottom") number is called the diastolic pressure. It is a measure of the pressure in your arteries as the heart relaxes. What are the causes? The cause of this condition is not known. What increases the risk? Some risk factors for high blood pressure are under your control. Others are not. Factors you can change  Smoking.  Having type 2 diabetes mellitus, high cholesterol, or both.  Not getting enough exercise or physical activity.  Being overweight.  Having too much fat, sugar, calories, or salt (sodium) in your diet.  Drinking too much alcohol. Factors that are difficult or impossible to change  Having chronic kidney disease.  Having a family history of high blood pressure.  Age. Risk increases with age.  Race. You may be at higher risk if you are African-American.  Gender. Men are at higher risk than women before age 45. After age 65, women are at higher risk than men.  Having obstructive sleep apnea.  Stress. What are the signs or symptoms? Extremely high blood pressure (hypertensive crisis) may cause:  Headache.  Anxiety.  Shortness of breath.  Nosebleed.  Nausea and vomiting.  Severe chest pain.  Jerky movements you cannot control (seizures).  How is this  diagnosed? This condition is diagnosed by measuring your blood pressure while you are seated, with your arm resting on a surface. The cuff of the blood pressure monitor will be placed directly against the skin of your upper arm at the level of your heart. It should be measured at least twice using the same arm. Certain conditions can cause a difference in blood pressure between your right and left arms. Certain factors can cause blood pressure readings to be lower or higher than normal (elevated) for a short period of time:  When your blood pressure is higher when you are in a health care provider's office than when you are at home, this is called white coat hypertension. Most people with this condition do not need medicines.  When your blood pressure is higher at home than when you are in a health care provider's office, this is called masked hypertension. Most people with this condition may need medicines to control blood pressure.  If you have a high blood pressure reading during one visit or you have normal blood pressure with other risk factors:  You may be asked to return on a different day to have your blood pressure checked again.  You may be asked to monitor your blood pressure at home for 1 week or longer.  If you are diagnosed with hypertension, you may have other blood or imaging tests to help your health care provider understand your overall risk for other conditions. How is this treated? This condition is treated by making healthy lifestyle changes, such as eating healthy foods, exercising more, and reducing your alcohol intake. Your   health care provider may prescribe medicine if lifestyle changes are not enough to get your blood pressure under control, and if:  Your systolic blood pressure is above 130.  Your diastolic blood pressure is above 80.  Your personal target blood pressure may vary depending on your medical conditions, your age, and other factors. Follow these  instructions at home: Eating and drinking  Eat a diet that is high in fiber and potassium, and low in sodium, added sugar, and fat. An example eating plan is called the DASH (Dietary Approaches to Stop Hypertension) diet. To eat this way: ? Eat plenty of fresh fruits and vegetables. Try to fill half of your plate at each meal with fruits and vegetables. ? Eat whole grains, such as whole wheat pasta, brown rice, or whole grain bread. Fill about one quarter of your plate with whole grains. ? Eat or drink low-fat dairy products, such as skim milk or low-fat yogurt. ? Avoid fatty cuts of meat, processed or cured meats, and poultry with skin. Fill about one quarter of your plate with lean proteins, such as fish, chicken without skin, beans, eggs, and tofu. ? Avoid premade and processed foods. These tend to be higher in sodium, added sugar, and fat.  Reduce your daily sodium intake. Most people with hypertension should eat less than 1,500 mg of sodium a day.  Limit alcohol intake to no more than 1 drink a day for nonpregnant women and 2 drinks a day for men. One drink equals 12 oz of beer, 5 oz of wine, or 1 oz of hard liquor. Lifestyle  Work with your health care provider to maintain a healthy body weight or to lose weight. Ask what an ideal weight is for you.  Get at least 30 minutes of exercise that causes your heart to beat faster (aerobic exercise) most days of the week. Activities may include walking, swimming, or biking.  Include exercise to strengthen your muscles (resistance exercise), such as pilates or lifting weights, as part of your weekly exercise routine. Try to do these types of exercises for 30 minutes at least 3 days a week.  Do not use any products that contain nicotine or tobacco, such as cigarettes and e-cigarettes. If you need help quitting, ask your health care provider.  Monitor your blood pressure at home as told by your health care provider.  Keep all follow-up visits as  told by your health care provider. This is important. Medicines  Take over-the-counter and prescription medicines only as told by your health care provider. Follow directions carefully. Blood pressure medicines must be taken as prescribed.  Do not skip doses of blood pressure medicine. Doing this puts you at risk for problems and can make the medicine less effective.  Ask your health care provider about side effects or reactions to medicines that you should watch for. Contact a health care provider if:  You think you are having a reaction to a medicine you are taking.  You have headaches that keep coming back (recurring).  You feel dizzy.  You have swelling in your ankles.  You have trouble with your vision. Get help right away if:  You develop a severe headache or confusion.  You have unusual weakness or numbness.  You feel faint.  You have severe pain in your chest or abdomen.  You vomit repeatedly.  You have trouble breathing. Summary  Hypertension is when the force of blood pumping through your arteries is too strong. If this condition is not   controlled, it may put you at risk for serious complications.  Your personal target blood pressure may vary depending on your medical conditions, your age, and other factors. For most people, a normal blood pressure is less than 120/80.  Hypertension is treated with lifestyle changes, medicines, or a combination of both. Lifestyle changes include weight loss, eating a healthy, low-sodium diet, exercising more, and limiting alcohol. This information is not intended to replace advice given to you by your health care provider. Make sure you discuss any questions you have with your health care provider. Document Released: 04/18/2005 Document Revised: 03/16/2016 Document Reviewed: 03/16/2016 Elsevier Interactive Patient Education  2018 [employer].     Preventing Type 2 Diabetes Mellitus Type 2 diabetes (type 2 diabetes mellitus)  is a long-term (chronic) disease that affects blood sugar (glucose) levels. Normally, a hormone called insulin allows glucose to enter cells in the body. The cells use glucose for energy. In type 2 diabetes, one or both of these problems may be present:  The body does not make enough insulin.  The body does not respond properly to insulin that it makes (insulin resistance).  Insulin resistance or lack of insulin causes excess glucose to build up in the blood instead of going into cells. As a result, high blood glucose (hyperglycemia) develops, which can cause many complications. Being overweight or obese and having an inactive (sedentary) lifestyle can increase your risk for diabetes. Type 2 diabetes can be delayed or prevented by making certain nutrition and lifestyle changes. What nutrition changes can be made?  Eat healthy meals and snacks regularly. Keep a healthy snack with you for when you get hungry between meals, such as fruit or a handful of nuts.  Eat lean meats and proteins that are low in saturated fats, such as chicken, fish, egg whites, and beans. Avoid processed meats.  Eat plenty of fruits and vegetables and plenty of grains that have not been processed (whole grains). It is recommended that you eat: ? 1?2 cups of fruit every day. ? 2?3 cups of vegetables every day. ? 6?8 oz of whole grains every day, such as oats, whole wheat, bulgur, brown rice, quinoa, and millet.  Eat low-fat dairy products, such as milk, yogurt, and cheese.  Eat foods that contain healthy fats, such as nuts, avocado, olive oil, and canola oil.  Drink water throughout the day. Avoid drinks that contain added sugar, such as soda or sweet tea.  Follow instructions from your health care provider about specific eating or drinking restrictions.  Control how much food you eat at a time (portion size). ? Check food labels to find out the serving sizes of foods. ? Use a kitchen scale to weigh amounts of  foods.  Saute or steam food instead of frying it. Cook with water or broth instead of oils or butter.  Limit your intake of: ? Salt (sodium). Have no more than 1 tsp (2,400 mg) of sodium a day. If you have heart disease or high blood pressure, have less than ? tsp (1,500 mg) of sodium a day. ? Saturated fat. This is fat that is solid at room temperature, such as butter or fat on meat. What lifestyle changes can be made?  Activity  Do moderate-intensity physical activity for at least 30 minutes on at least 5 days of the week, or as much as told by your health care provider.  Ask your health care provider what activities are safe for you. A mix of physical activities may  be best, such as walking, swimming, cycling, and strength training.  Try to add physical activity into your day. For example: ? Park in spots that are farther away than usual, so that you walk more. For example, park in a far corner of the parking lot when you go to the office or the grocery store. ? Take a walk during your lunch break. ? Use stairs instead of elevators or escalators. Weight Loss  Lose weight as directed. Your health care provider can determine how much weight loss is best for you and can help you lose weight safely.  If you are overweight or obese, you may be instructed to lose at least 5?7 % of your body weight. Alcohol and Tobacco   Limit alcohol intake to no more than 1 drink a day for nonpregnant women and 2 drinks a day for men. One drink equals 12 oz of beer, 5 oz of wine, or 1 oz of hard liquor.  Do not use any tobacco products, such as cigarettes, chewing tobacco, and e-cigarettes. If you need help quitting, ask your health care provider. Work With [place] Provider  Have your blood glucose tested regularly, as told by your health care provider.  Discuss your risk factors and how you can reduce your risk for diabetes.  Get screening tests as told by your health care provider. You  may have screening tests regularly, especially if you have certain risk factors for type 2 diabetes.  Make an appointment with a diet and nutrition specialist (registered dietitian). A registered dietitian can help you make a healthy eating plan and can help you understand portion sizes and food labels. Why are these changes important?  It is possible to prevent or delay type 2 diabetes and related health problems by making lifestyle and nutrition changes.  It can be difficult to recognize signs of type 2 diabetes. The best way to avoid possible damage to your body is to take actions to prevent the disease before you develop symptoms. What can happen if changes are not made?  Your blood glucose levels may keep increasing. Having high blood glucose for a long time is dangerous. Too much glucose in your blood can damage your blood vessels, heart, kidneys, nerves, and eyes.  You may develop prediabetes or type 2 diabetes. Type 2 diabetes can lead to many chronic health problems and complications, such as: ? Heart disease. ? Stroke. ? Blindness. ? Kidney disease. ? Depression. ? Poor circulation in the feet and legs, which could lead to surgical removal (amputation) in severe cases. Where to find support:  Ask your health care provider to recommend a registered dietitian, diabetes educator, or weight loss program.  Look for local or online weight loss groups.  Join a gym, fitness club, or outdoor activity group, such as a walking club. Where to find more information: To learn more about diabetes and diabetes prevention, visit:  American Diabetes Association (ADA): www.diabetes.[employer] of Diabetes and Digestive and Kidney Diseases: FindSpin.nl  To learn more about healthy eating, visit:  The U.S. Department of Agriculture Scientist, research (physical sciences)), Choose My Plate: http://wiley-williams.com/  Office of Disease Prevention and Health Promotion  (ODPHP), Dietary Guidelines: SurferLive.at  Summary  You can reduce your risk for type 2 diabetes by increasing your physical activity, eating healthy foods, and losing weight as directed.  Talk with your health care provider about your risk for type 2 diabetes. Ask about any blood tests or screening tests that you need to have.  This information is not intended to replace advice given to you by your health care provider. Make sure you discuss any questions you have with your health care provider. Document Released: 08/10/2015 Document Revised: 09/24/2015 Document Reviewed: 06/09/2015 Elsevier Interactive Patient Education  2018 [employer].     Dizziness Dizziness is a common problem. It makes you feel unsteady or light-headed. You may feel like you are about to pass out (faint). Dizziness can lead to getting hurt if you stumble or fall. Dizziness can be caused by many things, including:  Medicines.  Not having enough water in your body (dehydration).  Illness.  Follow these instructions at home: Eating and drinking  Drink enough fluid to keep your pee (urine) clear or pale yellow. This helps to keep you from getting dehydrated. Try to drink more clear fluids, such as water.  Do not drink alcohol.  Limit how much caffeine you drink or eat, if your doctor tells you to do that.  Limit how much salt (sodium) you drink or eat, if your doctor tells you to do that. Activity  Avoid making quick movements. ? When you stand up from sitting in a chair, steady yourself until you feel okay. ? In the morning, first sit up on the side of the bed. When you feel okay, stand slowly while you hold onto something. Do this until you know that your balance is fine.  If you need to stand in one place for a long time, move your legs often. Tighten and relax the muscles in your legs while you are standing.  Do not drive or use heavy machinery if you feel dizzy.  Avoid bending  down if you feel dizzy. Place items in your home so you can reach them easily without leaning over. Lifestyle  Do not use any products that contain nicotine or tobacco, such as cigarettes and e-cigarettes. If you need help quitting, ask your doctor.  Try to lower your stress level. You can do this by using methods such as yoga or meditation. Talk with your doctor if you need help. General instructions  Watch your dizziness for any changes.  Take over-the-counter and prescription medicines only as told by your doctor. Talk with your doctor if you think that you are dizzy because of a medicine that you are taking.  Tell a friend or a family member that you are feeling dizzy. If he or she notices any changes in your behavior, have this person call your doctor.  Keep all follow-up visits as told by your doctor. This is important. Contact a doctor if:  Your dizziness does not go away.  Your dizziness or light-headedness gets worse.  You feel sick to your stomach (nauseous).  You have trouble hearing.  You have new symptoms.  You are unsteady on your feet.  You feel like the room is spinning. Get help right away if:  You throw up (vomit) or have watery poop (diarrhea), and you cannot eat or drink anything.  You have trouble: ? Talking. ? Walking. ? Swallowing. ? Using your arms, hands, or legs.  You feel generally weak.  You are not thinking clearly, or you have trouble forming sentences. A friend or family member may notice this.  You have: ? Chest pain. ? Pain in your belly (abdomen). ? Shortness of breath. ? Sweating.  Your vision changes.  You are bleeding.  You have a very bad headache.  You have neck pain or a stiff neck.  You have a  fever. These symptoms may be an emergency. Do not wait to see if the symptoms will go away. Get medical help right away. Call your local emergency services (911 in the [place] Do not drive yourself to the  hospital. Summary  Dizziness makes you feel unsteady or light-headed. You may feel like you are about to pass out (faint).  Drink enough fluid to keep your pee (urine) clear or pale yellow. Do not drink alcohol.  Avoid making quick movements if you feel dizzy.  Watch your dizziness for any changes. This information is not intended to replace advice given to you by your health care provider. Make sure you discuss any questions you have with your health care provider. Document Released: 04/07/2011 Document Revised: 05/05/2016 Document Reviewed: 05/05/2016 Elsevier Interactive Patient Education  2017 [employer].     IF you received an x-ray today, you will receive an invoice from [facility]. Please contact [facility] at [phone] with questions or concerns regarding your invoice.   IF you received labwork today, you will receive an invoice from [facility]. Please contact [facility] at [phone] with questions or concerns regarding your invoice.   Our billing staff will not be able to assist you with questions regarding bills from these companies.  You will be contacted with the lab results as soon as they are available. The fastest way to get your results is to activate your My Chart account. Instructions are located on the last page of this paperwork. If you have not heard from [place] regarding the results in 2 weeks, please contact this office.

## 2017-05-05 LAB — COMPREHENSIVE METABOLIC PANEL
ALBUMIN: 4.1 g/dL (ref 3.6–4.8)
ALK PHOS: 106 IU/L (ref 39–117)
ALT: 21 IU/L (ref 0–44)
AST: 22 IU/L (ref 0–40)
Albumin/Globulin Ratio: 1.4 (ref 1.2–2.2)
BUN / CREAT RATIO: 11 (ref 10–24)
BUN: 10 mg/dL (ref 8–27)
CHLORIDE: 102 mmol/L (ref 96–106)
CO2: 22 mmol/L (ref 20–29)
CREATININE: 0.93 mg/dL (ref 0.76–1.27)
Calcium: 9.4 mg/dL (ref 8.6–10.2)
GFR calc Af Amer: 103 mL/min/{1.73_m2} (ref >59)
GFR calc non Af Amer: 89 mL/min/{1.73_m2} (ref >59)
GLOBULIN, TOTAL: 2.9 g/dL (ref 1.5–4.5)
GLUCOSE: 118 mg/dL — AB (ref 65–99)
Potassium: 4.3 mmol/L (ref 3.5–5.2)
SODIUM: 139 mmol/L (ref 134–144)
Total Protein: 7 g/dL (ref 6.0–8.5)

## 2017-05-05 LAB — LIPID PANEL
CHOLESTEROL TOTAL: 266 mg/dL — AB (ref 100–199)
Chol/HDL Ratio: 6.7 ratio — ABNORMAL HIGH (ref 0.0–5.0)
HDL: 40 mg/dL (ref >39)
LDL CALC: 177 mg/dL — AB (ref 0–99)
TRIGLYCERIDES: 244 mg/dL — AB (ref 0–149)
VLDL Cholesterol Cal: 49 mg/dL — ABNORMAL HIGH (ref 5–40)

## 2017-05-09 ENCOUNTER — Other Ambulatory Visit: Payer: Self-pay | Admitting: Urgent Care

## 2017-05-09 MED ORDER — ATORVASTATIN CALCIUM 40 MG PO TABS: 40.0000 mg | ORAL_TABLET | Freq: Every day | ORAL | 3 refills | Status: DC

## 2017-05-29 ENCOUNTER — Other Ambulatory Visit: Payer: Self-pay | Admitting: Orthopedic Surgery

## 2017-05-29 ENCOUNTER — Ambulatory Visit
Admission: RE | Admit: 2017-05-29 | Discharge: 2017-05-29 | Disposition: A | Discharge status: Home / Self Care | Payer: BLUE CROSS/BLUE SHIELD | Source: Ambulatory Visit | Attending: Orthopedic Surgery | Admitting: Orthopedic Surgery

## 2017-05-29 DIAGNOSIS — R52 Pain, unspecified: Secondary | ICD-10-CM

## 2017-06-03 ENCOUNTER — Encounter: Payer: Self-pay | Admitting: Physician Assistant

## 2017-06-03 DIAGNOSIS — M7062 Trochanteric bursitis, left hip: Secondary | ICD-10-CM | POA: Insufficient documentation

## 2017-08-02 ENCOUNTER — Encounter: Payer: Self-pay | Admitting: Physician Assistant

## 2018-03-24 IMAGING — RF DG C-ARM 61-120 MIN
1 series · 2 of 2 positions shown · non-contrast
Comparison: Preoperative right hip series of November 02, 2015

CLINICAL DATA: Anterior approach right hip joint prosthesis
placement. Fluoro time reported is 28 seconds

EXAM:
DG C-ARM 61-120 MIN; OPERATIVE RIGHT HIP WITH PELVIS

[Series 1: run · 2 of 2 slices shown]
[im 1/2]
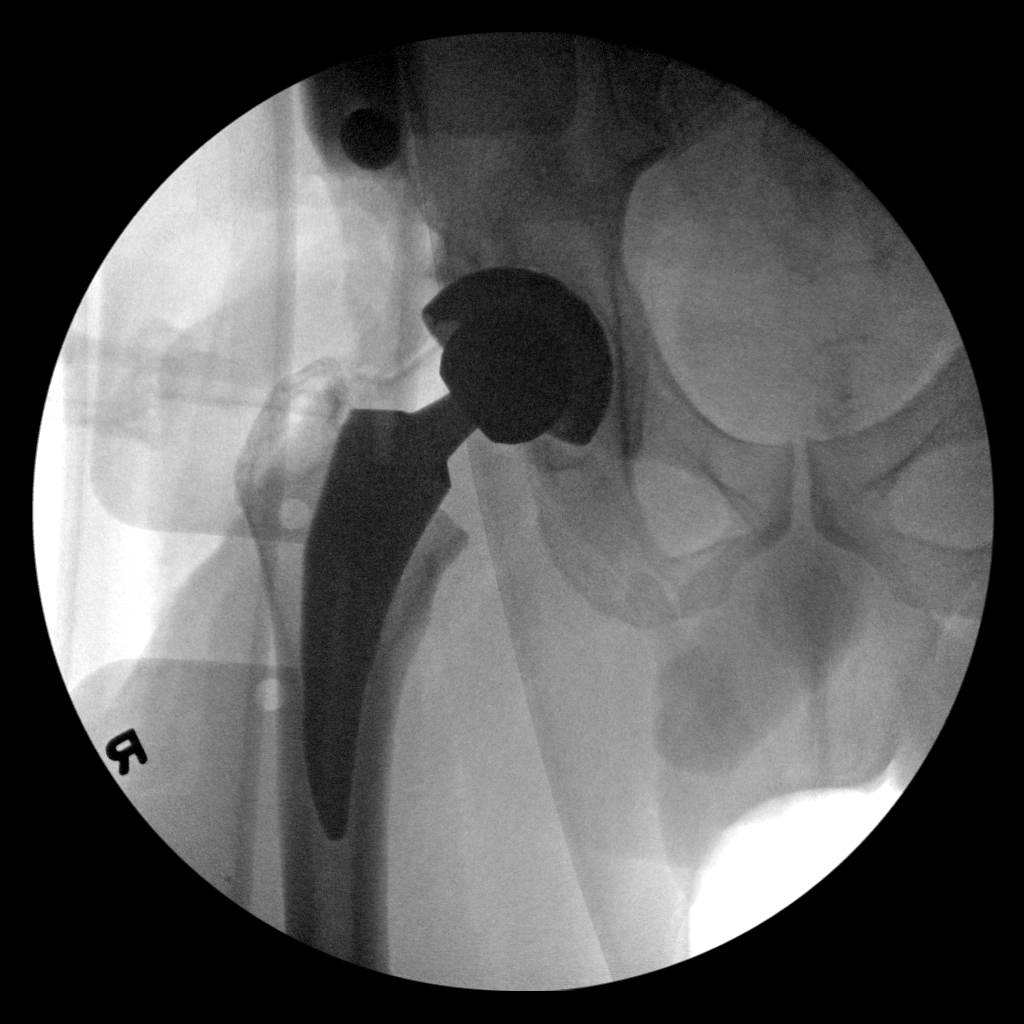
[im 2/2]
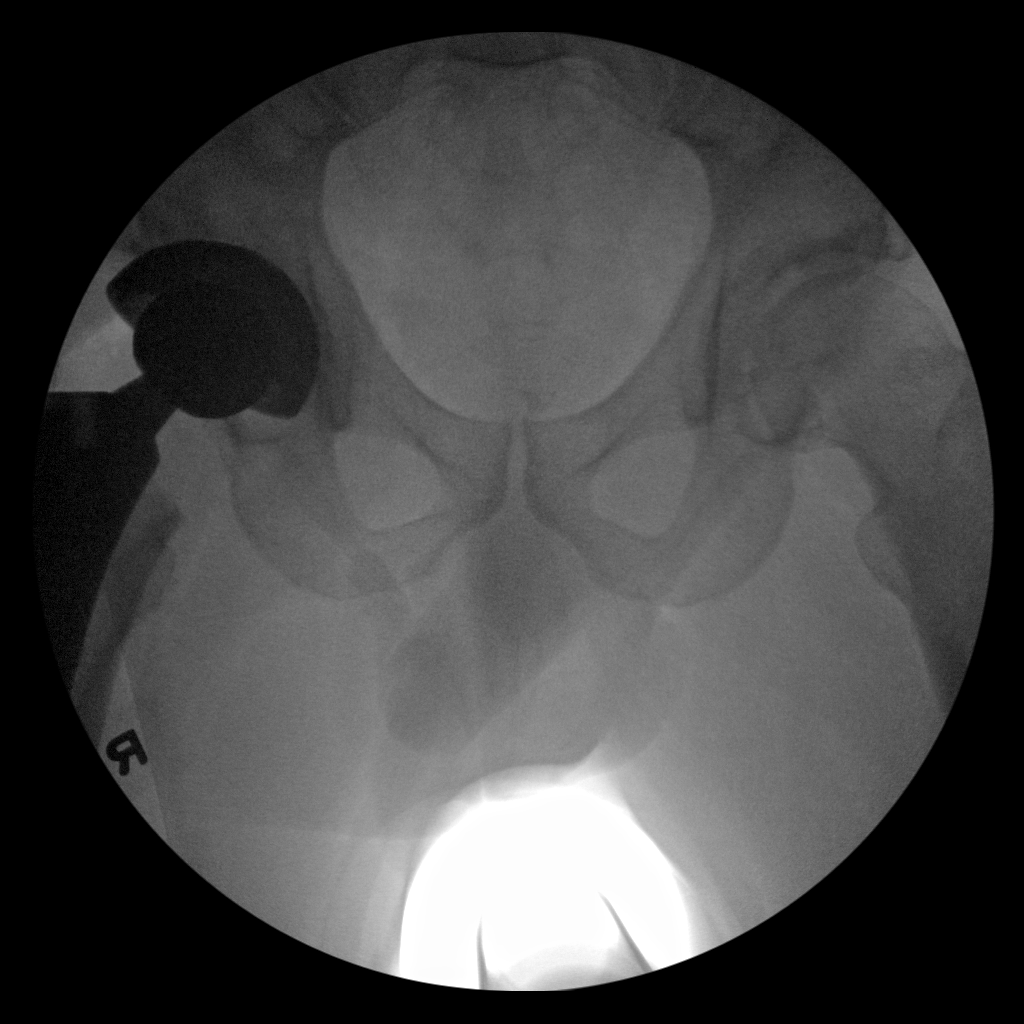

[2 of 2 positions shown; findings below may reference images not displayed]

FINDINGS: The patient has undergone right total hip joint prosthesis
placement. Radiographic positioning of the prosthetic components is
good. The interface with the native bone is normal.
IMPRESSION: No immediate postprocedure complication following right total hip
joint prosthesis placement.

## 2018-04-24 ENCOUNTER — Other Ambulatory Visit: Payer: Self-pay | Admitting: Urgent Care

## 2018-04-30 NOTE — Telephone Encounter (Signed)
Called and spoke with pt regarding his refills. I advised that they would be at his pharmacy however, he would need to make an OV with a new provider before any further refills will be given. I advised him that we are booking pretty far out and offered to make an appt for him. Pt declined and stated that he would call next week. If pt calls back, please schedule an OV - Establish Care with Drs. Marga Hoots or [place]. Thank you!

## 2018-08-11 IMAGING — DX DG BONE LENGTH
1 series · 1 of 1 positions shown · non-contrast
Comparison: None.

CLINICAL DATA: Leg-length discrepancy. Prior right total hip
arthroplasty 01/09/2017

EXAM:
BONE LENGTH

[dg leg length]
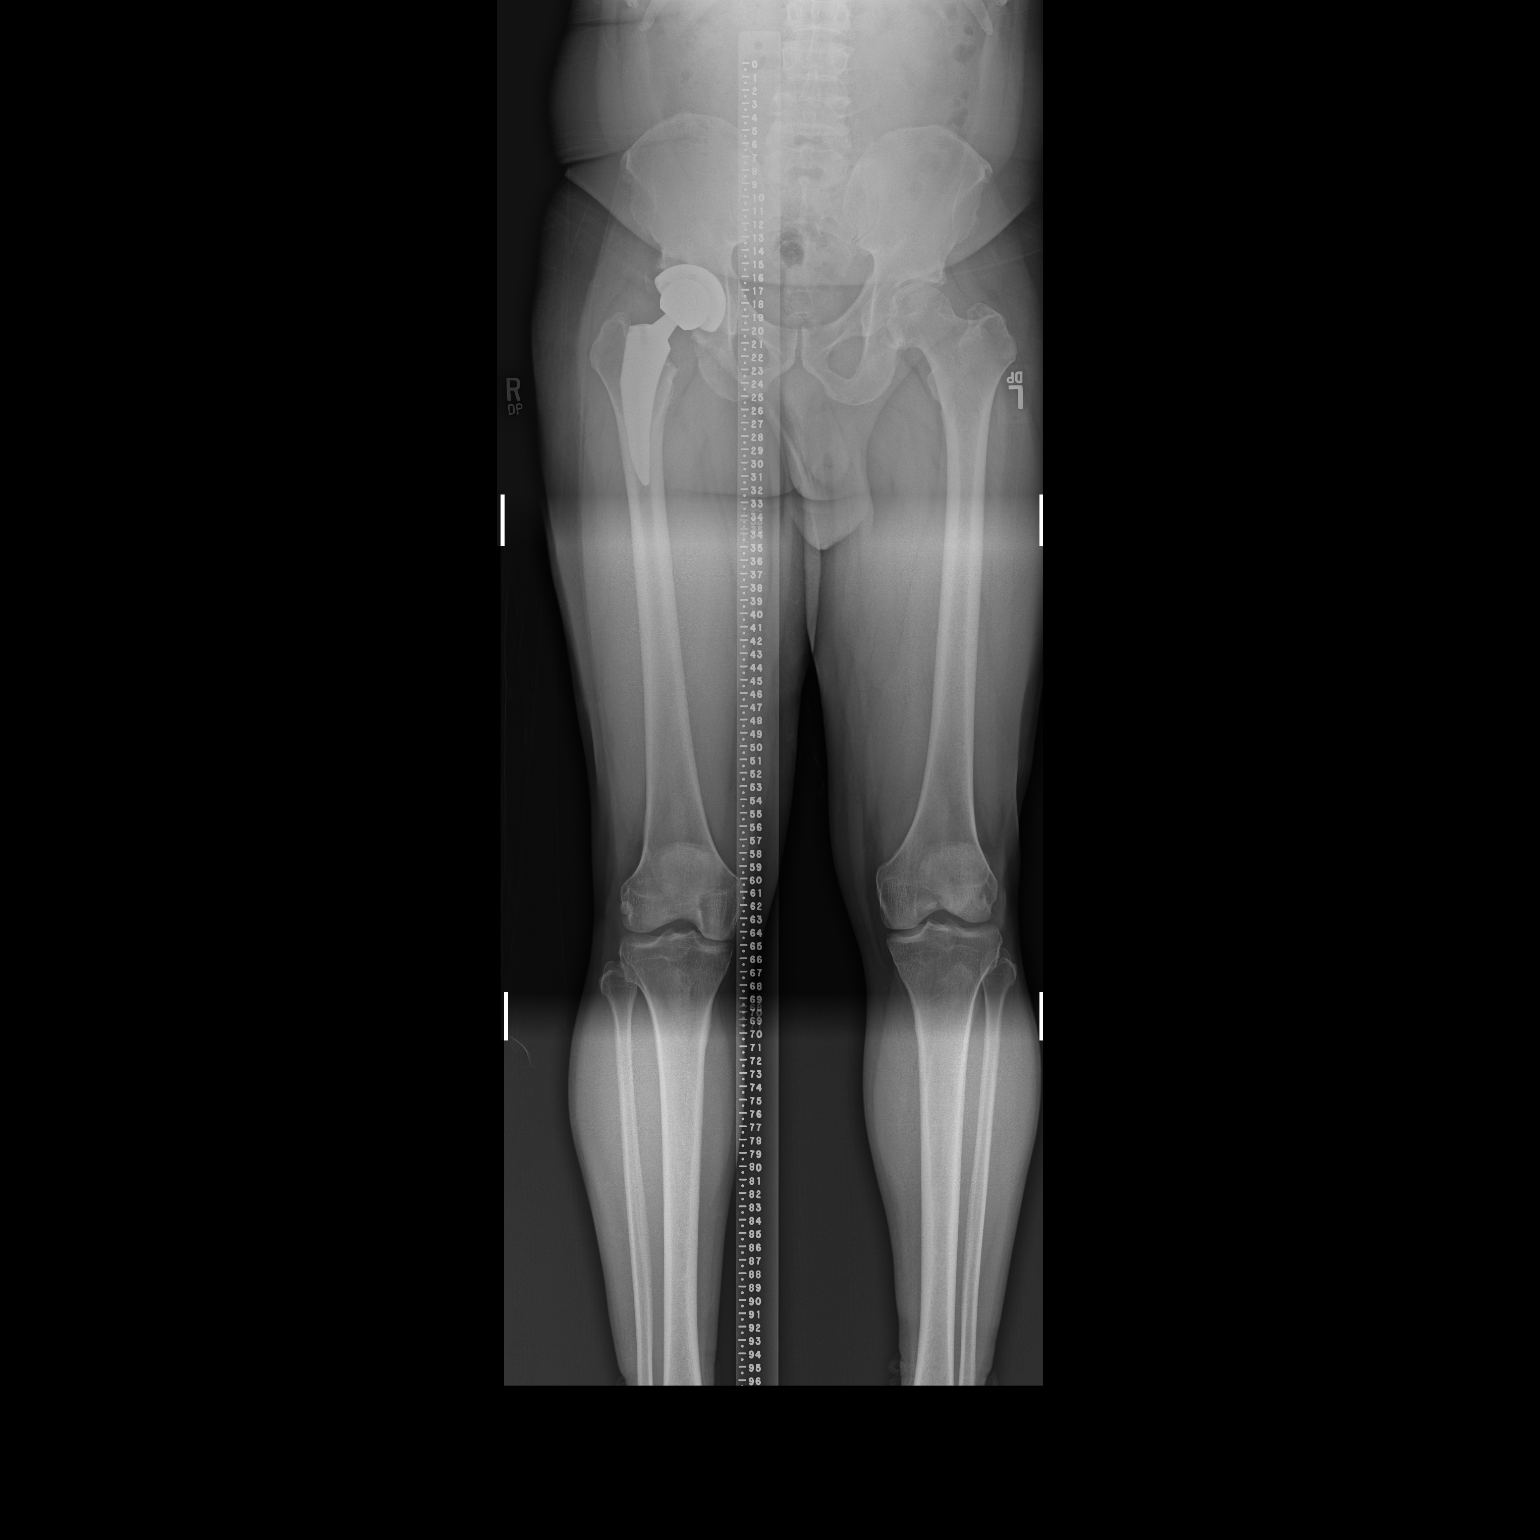

[1 of 1 positions shown; findings below may reference images not displayed]

FINDINGS: The right leg length from the middle of the prosthetic femoral head
to the talar dome is 85 cm.

The left leg length from the middle of the femoral head to the talar
dome is 83 cm.
IMPRESSION: 2 cm leg length discrepancy.

## 2018-10-21 ENCOUNTER — Other Ambulatory Visit: Payer: Self-pay | Admitting: Family Medicine

## 2018-11-02 ENCOUNTER — Other Ambulatory Visit: Payer: Self-pay | Admitting: Family Medicine

## 2018-11-02 NOTE — Telephone Encounter (Signed)
Forwarding medication refills to provider for review.

## 2018-12-18 ENCOUNTER — Other Ambulatory Visit: Payer: Self-pay | Admitting: Family Medicine

## 2019-11-19 ENCOUNTER — Encounter: Payer: BLUE CROSS/BLUE SHIELD | Admitting: Gastroenterology

## 2021-02-25 ENCOUNTER — Other Ambulatory Visit (HOSPITAL_BASED_OUTPATIENT_CLINIC_OR_DEPARTMENT_OTHER): Payer: Self-pay

## 2021-02-25 MED ORDER — INFLUENZA VAC SPLIT QUAD 0.5 ML IM SUSY
PREFILLED_SYRINGE | INTRAMUSCULAR | 0 refills | Status: DC
  Filled 2021-02-25: qty 0.5, 1d supply, fill #0

## 2022-01-31 ENCOUNTER — Ambulatory Visit (INDEPENDENT_AMBULATORY_CARE_PROVIDER_SITE_OTHER): Payer: Medicare HMO

## 2022-01-31 ENCOUNTER — Ambulatory Visit: Payer: Self-pay

## 2022-01-31 ENCOUNTER — Ambulatory Visit (INDEPENDENT_AMBULATORY_CARE_PROVIDER_SITE_OTHER): Payer: Medicare HMO | Admitting: Sports Medicine

## 2022-01-31 VITALS — BP 173/99 | HR 84

## 2022-01-31 DIAGNOSIS — M79605 Pain in left leg: Secondary | ICD-10-CM

## 2022-01-31 MED ORDER — METHYLPREDNISOLONE ACETATE 40 MG/ML IJ SUSP
40.0000 mg | INTRAMUSCULAR | Status: AC | PRN
  Administered 2022-01-31: 40 mg via INTRA_ARTICULAR

## 2022-01-31 MED ORDER — LIDOCAINE HCL 1 % IJ SOLN
4.0000 mL | INTRAMUSCULAR | Status: AC | PRN
  Administered 2022-01-31: 4 mL

## 2022-01-31 NOTE — Progress Notes (Signed)
The patient is someone I saw her last in 2018.  At that time he had had a right total hip arthroplasty performed by one of my colleagues in town.  He did end up having a likely discrepancy and we recommended an insert to wear at all times in his left shoe.  He is now having left hip and groin pain that has been slowly getting worse.  He is an active 67 year old gentleman.  He does work in the [employer] at [employer].  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  It started with his knee.  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  This is slowly getting worse and he does report some stiffness.  It is beginning to detrimentally affect his mobility, his quality of life and his actives daily living.  He does not report any significant health issues otherwise.  On my exam today he does have a leg length discrepancy still with his left side shorter than the right.  His left knee exam does not show any significant problems with flexion extension.  There is no effusion.  There are some slight patellofemoral crepitation but his knee is ligamentously stable and this does not seem to be the source of his pain.  He does have stiffness with internal and external rotation of his left hip.  There is no significant blocks to rotation but is definitely stiff.  An AP pelvis lateral left hip does show worsening arthritic changes over the last 5 years when I compared these to films from 2018.  There is superior lateral joint space narrowing that is more significant and an acetabular cyst.  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  There is left knee x-rays show some patellofemoral arthritic changes but well-maintained medial lateral compartments.  On the standing views you can tell there is a leg  length discrepancy with his left side shorter than the right.  This is also evident on plain films of the pelvis.  At this point we did talk about the possibility of hip replacement surgery.  He would like to try conservative treatment with considering an intra-articular steroid injection in his left hip joint.  We can see if this can be set up even today possibly with my partner Dr. Rolena Infante who can hopefully place an injection of a steroid in his left hip under imaging such as ultrasound.  I did give him our surgery scheduler's card to consider hip replacement at some point when he feels like he is ready for this.  All questions and concerns were answered and addressed.

## 2022-01-31 NOTE — Progress Notes (Signed)
   Procedure Note  Patient: Alex Hunter             Date of Birth: 1955/09/02           MRN: [id number]             Visit Date: 01/31/2022  Procedures: Visit Diagnoses:  1. Pain in left leg    Large Joint Inj: L hip joint on 01/31/2022 9:24 AM Indications: pain Details: 22 G 3.5 in needle, ultrasound-guided anterior approach Medications: 4 mL lidocaine 1 %; 40 mg methylPREDNISolone acetate 40 MG/ML  Procedure: US-guided intra-articular hip injection, Left After discussion on risks/benefits/indications and informed verbal consent was obtained, a timeout was performed. Patient was lying supine on exam table. The hip was cleaned with betadine and alcohol swabs. Then utilizing ultrasound guidance, the patient's femoral head and neck junction was identified and subsequently injected with 4:1 lidocaine:depomedrol via an in-plane approach with ultrasound visualization of the injectate administered into the hip joint. Patient tolerated procedure well without immediate complications.  Procedure, treatment alternatives, risks and benefits explained, specific risks discussed. Consent was given by the patient. Immediately prior to procedure a time out was called to verify the correct patient, procedure, equipment, support staff and site/side marked as required. Patient was prepped and draped in the usual sterile fashion.     - I evaluated the patient about 10 minutes post-injection and they had improvement in pain and range of motion - follow-up with Dr. Ninfa Linden as indicated; I am happy to see them as needed  Elba Barman, DO [place]  This note was dictated using Dragon naturally speaking software and may contain errors in syntax, spelling, or content which have not been identified prior to signing this note.

## 2022-01-31 NOTE — Addendum Note (Signed)
Addended by: Otelia Sergeant on: 01/31/2022 09:20 AM   Modules accepted: Orders

## 2022-02-03 DIAGNOSIS — H35033 Hypertensive retinopathy, bilateral: Secondary | ICD-10-CM | POA: Diagnosis not present

## 2022-02-03 DIAGNOSIS — H25012 Cortical age-related cataract, left eye: Secondary | ICD-10-CM | POA: Diagnosis not present

## 2022-02-03 DIAGNOSIS — E119 Type 2 diabetes mellitus without complications: Secondary | ICD-10-CM | POA: Diagnosis not present

## 2022-02-07 DIAGNOSIS — E785 Hyperlipidemia, unspecified: Secondary | ICD-10-CM | POA: Diagnosis not present

## 2022-02-07 DIAGNOSIS — R7989 Other specified abnormal findings of blood chemistry: Secondary | ICD-10-CM | POA: Diagnosis not present

## 2022-02-07 DIAGNOSIS — I10 Essential (primary) hypertension: Secondary | ICD-10-CM | POA: Diagnosis not present

## 2022-02-07 DIAGNOSIS — E1165 Type 2 diabetes mellitus with hyperglycemia: Secondary | ICD-10-CM | POA: Diagnosis not present

## 2022-02-07 DIAGNOSIS — Z125 Encounter for screening for malignant neoplasm of prostate: Secondary | ICD-10-CM | POA: Diagnosis not present

## 2022-02-08 ENCOUNTER — Ambulatory Visit: Payer: BLUE CROSS/BLUE SHIELD | Admitting: Orthopaedic Surgery

## 2022-02-15 DIAGNOSIS — E785 Hyperlipidemia, unspecified: Secondary | ICD-10-CM | POA: Diagnosis not present

## 2022-02-15 DIAGNOSIS — E1159 Type 2 diabetes mellitus with other circulatory complications: Secondary | ICD-10-CM | POA: Diagnosis not present

## 2022-02-15 DIAGNOSIS — I10 Essential (primary) hypertension: Secondary | ICD-10-CM | POA: Diagnosis not present

## 2022-02-15 DIAGNOSIS — M169 Osteoarthritis of hip, unspecified: Secondary | ICD-10-CM | POA: Diagnosis not present

## 2022-02-15 DIAGNOSIS — Z0000 Encounter for general adult medical examination without abnormal findings: Secondary | ICD-10-CM | POA: Diagnosis not present

## 2022-02-15 DIAGNOSIS — Z23 Encounter for immunization: Secondary | ICD-10-CM | POA: Diagnosis not present

## 2022-03-18 ENCOUNTER — Encounter: Payer: Self-pay | Admitting: Internal Medicine

## 2022-05-05 ENCOUNTER — Ambulatory Visit: Payer: Self-pay

## 2022-05-05 ENCOUNTER — Encounter: Payer: Self-pay | Admitting: Sports Medicine

## 2022-05-05 ENCOUNTER — Ambulatory Visit: Payer: Medicare HMO | Admitting: Sports Medicine

## 2022-05-05 DIAGNOSIS — M2170 Unequal limb length (acquired), unspecified site: Secondary | ICD-10-CM

## 2022-05-05 DIAGNOSIS — M79605 Pain in left leg: Secondary | ICD-10-CM | POA: Diagnosis not present

## 2022-05-05 DIAGNOSIS — M1612 Unilateral primary osteoarthritis, left hip: Secondary | ICD-10-CM

## 2022-05-05 MED ORDER — METHYLPREDNISOLONE ACETATE 40 MG/ML IJ SUSP
80.0000 mg | INTRAMUSCULAR | Status: AC | PRN
  Administered 2022-05-05: 80 mg via INTRA_ARTICULAR

## 2022-05-05 MED ORDER — LIDOCAINE HCL 1 % IJ SOLN
4.0000 mL | INTRAMUSCULAR | Status: AC | PRN
  Administered 2022-05-05: 4 mL

## 2022-05-05 NOTE — Progress Notes (Signed)
Alex Hunter - 67 y.o. male MRN [id number]  Date of birth: 07/26/1955  Office Visit Note: Visit Date: 05/05/2022 PCP: Pcp, No Referred by: No ref. provider found  Subjective: Chief Complaint  Patient presents with   Left Hip - Pain   HPI: Alex Hunter is a pleasant 67 y.o. male who presents today for follow-up of left hip pain.  He has known advanced arthritis of the left hip.  Positive bilateral right hip replacement.  He did see me a little over 3 months ago and we did perform an ultrasound-guided hip injection, he states this helped excellent compliance.  This most helpful in injection in terms of pain relief.  Recently his pain has started to creep back up and he is going on a trip coming.  He is hoping for an additional injection today.  He has good some pain when going up and down steps.  Alex Hunter also reports having a leg length discrepancy.  He has used an insert in the left [place] before, but this is old and wearing down.  Pertinent ROS were reviewed with the patient and found to be negative unless otherwise specified above in HPI.   Assessment & Plan: Visit Diagnoses:  1. Pain in left leg   2. Unilateral primary osteoarthritis, left hip   3. Leg length discrepancy    Plan: Discussed treatment options for his left hip arthritis.  Patient notes he has rather advanced arthritis, however he is not ready at this point to undergo hip replacement.  He did get excellent relief for about 3 months from his previous injection.  Through shared decision making, elected to repeat this today.  He may use ice, over-the-counter anti-inflammatories for any postinjection pain and pain forward.  We did also place a 7'16" heel lift into the left shoe today, found this to be comfortable with correction of gait.  He will follow-up with me as needed.  Follow-up: Return if symptoms worsen or fail to improve.   Meds & Orders: No orders of the defined types were placed in this encounter.    Orders Placed This Encounter  Procedures   Large Joint Inj   US Guided Needle Placement - No Linked Charges     Procedures: Large Joint Inj: L hip joint on 05/05/2022 9:31 AM Indications: pain Details: 22 G 3.5 in needle, ultrasound-guided anterior approach Medications: 4 mL lidocaine 1 %; 80 mg methylPREDNISolone acetate 40 MG/ML Outcome: tolerated well, no immediate complications  Procedure: US-guided intra-articular hip injection, left After discussion on risks/benefits/indications and informed verbal consent was obtained, a timeout was performed. Patient was lying supine on exam table. The hip was cleaned with betadine and alcohol swabs. Then utilizing ultrasound guidance, the patient's femoral head and neck junction was identified and subsequently injected with 4:2 lidocaine:depomedrol via an in-plane approach with ultrasound visualization of the injectate administered into the hip joint. Patient tolerated procedure well without immediate complications.  Procedure, treatment alternatives, risks and benefits explained, specific risks discussed. Consent was given by the patient. Immediately prior to procedure a time out was called to verify the correct patient, procedure, equipment, support staff and site/side marked as required. Patient was prepped and draped in the usual sterile fashion.          Clinical History: No specialty comments available.  He reports that he has quit smoking. His smoking use included cigarettes. He has a 5.00 pack-year smoking history. He has never used smokeless tobacco. No results for input(s): "HGBA1C", "LABURIC"  in the last 8760 hours.  Objective:   Vital Signs: There were no vitals taken for this visit.  Physical Exam  Gen: Well-appearing, in no acute distress; non-toxic CV: Regular Rate. Well-perfused. Warm.  Resp: Breathing unlabored on room air; no wheezing. Psych: Fluid speech in conversation; appropriate affect; normal thought process Neuro:  Sensation intact throughout. No gross coordination deficits.   Ortho Exam - Left hip: No bony TTP.  No effusion, redness or swelling about the joint.  There is some mild restriction to both active and passive range of motion with internal and to a lesser degree external rotation.  Positive FADIR test. Strength 5/5 throughout hip.   - Left leg is about 0.75" shorter than the right leg from the ASIS to the medial malleolus. Gait improved with heel lift.  Imaging:  - Hip x-ray 01/31/22:  An AP pelvis and lateral left hip shows worsening arthritic changes in the  left hip when compared to films from 2018.  There is cystic changes in the  acetabulum and femoral head as well as evidence of femoral acetabular  impingement and superior lateral joint space narrowing.   Past Medical/Family/Surgical/Social History: Medications & Allergies reviewed per EMR, new medications updated. Patient Active Problem List   Diagnosis Date Noted   Trochanteric bursitis of left hip 06/03/2017   Osteoarthritis of right hip 01/09/2017   Essential hypertension 03/06/2012   Perirectal abscess 03/06/2012   Hyperlipidemia 11/19/2009   Past Medical History:  Diagnosis Date   Arthritis    "right hip" (01/09/2017)   High blood pressure    High cholesterol    Family History  Problem Relation Age of Onset   Heart disease Mother    Leukemia Brother    Heart attack Brother    Past Surgical History:  Procedure Laterality Date   DENTAL SURGERY     "had an implant" (01/09/2017)   JOINT REPLACEMENT     TOTAL HIP ARTHROPLASTY Right 01/09/2017   TOTAL HIP ARTHROPLASTY Right 01/09/2017   Procedure: RIGHT TOTAL HIP ARTHROPLASTY ANTERIOR APPROACH;  Surgeon: Rod Can, MD;  Location: [facility];  Service: Orthopedics;  Laterality: Right;   Social History   Occupational History   Not on file  Tobacco Use   Smoking status: Former    Packs/day: 0.50    Years: 10.00    Total pack years: 5.00    Types: Cigarettes    Smokeless tobacco: Never   Tobacco comments:    "quit smoking in the 1980s"  Vaping Use   Vaping Use: Never used  Substance and Sexual Activity   Alcohol use: Yes    Comment: 01/09/2017 "once/year"   Drug use: Yes    Types: Cocaine    Comment: "went thru treatment back in the 1980s"   Sexual activity: Yes

## 2022-05-09 ENCOUNTER — Encounter: Payer: Self-pay | Admitting: Gastroenterology

## 2022-05-18 ENCOUNTER — Ambulatory Visit (AMBULATORY_SURGERY_CENTER): Payer: Medicare HMO

## 2022-05-18 VITALS — Ht 66.0 in | Wt 217.0 lb

## 2022-05-18 DIAGNOSIS — Z1211 Encounter for screening for malignant neoplasm of colon: Secondary | ICD-10-CM

## 2022-05-18 MED ORDER — PEG 3350-KCL-NA BICARB-NACL 420 G PO SOLR: 4000.0000 mL | Freq: Once | ORAL | 0 refills | Status: AC

## 2022-05-18 NOTE — Progress Notes (Signed)
No egg or soy allergy known to patient  No issues known to pt with past sedation with any surgeries or procedures Patient denies ever being told they had issues or difficulty with intubation  No FH of Malignant Hyperthermia Pt is not on diet pills Pt is not on  home 02  Pt is not on blood thinners  Pt denies issues with constipation  No A fib or A flutter Have any cardiac testing pending--NO Pt instructed to use Singlecare.com or GoodRx for a price reduction on prep    Patient's chart reviewed by Alex Hunter CNRA prior to previsit and patient appropriate for the [facility].  Previsit completed and red dot placed by patient's name on their procedure day (on provider's schedule).    

## 2022-05-19 DIAGNOSIS — I10 Essential (primary) hypertension: Secondary | ICD-10-CM | POA: Diagnosis not present

## 2022-05-19 DIAGNOSIS — N529 Male erectile dysfunction, unspecified: Secondary | ICD-10-CM | POA: Diagnosis not present

## 2022-05-19 DIAGNOSIS — E785 Hyperlipidemia, unspecified: Secondary | ICD-10-CM | POA: Diagnosis not present

## 2022-05-19 DIAGNOSIS — E1159 Type 2 diabetes mellitus with other circulatory complications: Secondary | ICD-10-CM | POA: Diagnosis not present

## 2022-06-09 ENCOUNTER — Encounter: Payer: Self-pay | Admitting: Gastroenterology

## 2022-06-20 ENCOUNTER — Telehealth: Payer: Self-pay | Admitting: Gastroenterology

## 2022-06-20 NOTE — Telephone Encounter (Signed)
Patient never received instructions in the mail.  Discussed instructions over the phone and printing for patient to pick up on 4th floor today.  Patient verbalized understanding.

## 2022-06-20 NOTE — Telephone Encounter (Signed)
Inbound call from patient stating that he is scheduled to have a colonoscopy tomorrow 2/20 at 8:30 with Dr. Havery Moros. Patient called to discuss when he was supposed to start his prep. I advised him of what times and no solid foods. Patient stated he did not have Dulcolax tablets. Patient is called to discuss his prep instructions further. Please advise.

## 2022-06-21 ENCOUNTER — Encounter: Payer: Self-pay | Admitting: Gastroenterology

## 2022-06-21 ENCOUNTER — Ambulatory Visit (AMBULATORY_SURGERY_CENTER): Payer: Self-pay | Admitting: Gastroenterology

## 2022-06-21 VITALS — BP 146/85 | HR 62 | Temp 97.6°F | Resp 16 | Ht 66.0 in | Wt 210.0 lb

## 2022-06-21 DIAGNOSIS — D124 Benign neoplasm of descending colon: Secondary | ICD-10-CM

## 2022-06-21 DIAGNOSIS — K635 Polyp of colon: Secondary | ICD-10-CM

## 2022-06-21 DIAGNOSIS — Z1211 Encounter for screening for malignant neoplasm of colon: Secondary | ICD-10-CM

## 2022-06-21 DIAGNOSIS — D122 Benign neoplasm of ascending colon: Secondary | ICD-10-CM

## 2022-06-21 HISTORY — PX: COLONOSCOPY: SHX174

## 2022-06-21 MED ORDER — SODIUM CHLORIDE 0.9 % IV SOLN: 500.0000 mL | Freq: Once | INTRAVENOUS | Status: DC

## 2022-06-21 NOTE — Patient Instructions (Signed)
Discharge instructions given. Handouts on polyps and Hemorrhoids. Resume previous medications. YOU HAD AN ENDOSCOPIC PROCEDURE TODAY AT [facility]:   Refer to the procedure report that was given to you for any specific questions about what was found during the examination.  If the procedure report does not answer your questions, please call your gastroenterologist to clarify.  If you requested that your care partner not be given the details of your procedure findings, then the procedure report has been included in a sealed envelope for you to review at your convenience later.  YOU SHOULD EXPECT: Some feelings of bloating in the abdomen. Passage of more gas than usual.  Walking can help get rid of the air that was put into your GI tract during the procedure and reduce the bloating. If you had a lower endoscopy (such as a colonoscopy or flexible sigmoidoscopy) you may notice spotting of blood in your stool or on the toilet paper. If you underwent a bowel prep for your procedure, you may not have a normal bowel movement for a few days.  Please Note:  You might notice some irritation and congestion in your nose or some drainage.  This is from the oxygen used during your procedure.  There is no need for concern and it should clear up in a day or so.  SYMPTOMS TO REPORT IMMEDIATELY:  Following lower endoscopy (colonoscopy or flexible sigmoidoscopy):  Excessive amounts of blood in the stool  Significant tenderness or worsening of abdominal pains  Swelling of the abdomen that is new, acute  Fever of 100F or higher   For urgent or emergent issues, a gastroenterologist can be reached at any hour by calling [phone]. Do not use MyChart messaging for urgent concerns.    DIET:  We do recommend a small meal at first, but then you may proceed to your regular diet.  Drink plenty of fluids but you should avoid alcoholic beverages for 24 hours.  ACTIVITY:  You should plan to take it  easy for the rest of today and you should NOT DRIVE or use heavy machinery until tomorrow (because of the sedation medicines used during the test).    FOLLOW UP: Our staff will call the number listed on your records the next business day following your procedure.  We will call around 7:15- 8:00 am to check on you and address any questions or concerns that you may have regarding the information given to you following your procedure. If we do not reach you, we will leave a message.     If any biopsies were taken you will be contacted by phone or by letter within the next 1-3 weeks.  Please call us at [phone] if you have not heard about the biopsies in 3 weeks.    SIGNATURES/CONFIDENTIALITY: You and/or your care partner have signed paperwork which will be entered into your electronic medical record.  These signatures attest to the fact that that the information above on your After Visit Summary has been reviewed and is understood.  Full responsibility of the confidentiality of this discharge information lies with you and/or your care-partner.

## 2022-06-21 NOTE — Progress Notes (Signed)
Called to room to assist during endoscopic procedure.  Patient ID and intended procedure confirmed with present staff. Received instructions for my participation in the procedure from the performing physician.  

## 2022-06-21 NOTE — Progress Notes (Signed)
Pt's states no medical or surgical changes since previsit or office visit. 

## 2022-06-21 NOTE — Progress Notes (Signed)
[facility] History and Physical   Primary Care Physician:  Michael Boston, MD   Reason for Procedure:   Colon cancer screening  Plan:    colonoscopy     HPI: Alex Hunter is a 67 y.o. male  here for colonoscopy screening - last exam > 10 years ago (2011). Patient denies any bowel symptoms at this time. No family history of colon cancer known. Otherwise feels well without any cardiopulmonary symptoms.   I have discussed risks / benefits of anesthesia and endoscopic procedure with Barbaraann Boys and they wish to proceed with the exams as outlined today.    Past Medical History:  Diagnosis Date   Anxiety    Arthritis    "right hip" (01/09/2017)   Asthma    Diabetes mellitus without complication ([place])    High blood pressure    High cholesterol     Past Surgical History:  Procedure Laterality Date   COLONOSCOPY  06/21/2022   COLONOSCOPY  09/03/2009   F/V, Erskine Emery, excellent prep, normal   DENTAL SURGERY     "had an implant" (01/09/2017)   JOINT REPLACEMENT     TOTAL HIP ARTHROPLASTY Right 01/09/2017   TOTAL HIP ARTHROPLASTY Right 01/09/2017   Procedure: RIGHT TOTAL HIP ARTHROPLASTY ANTERIOR APPROACH;  Surgeon: Rod Can, MD;  Location: [facility];  Service: Orthopedics;  Laterality: Right;    Prior to Admission medications   Medication Sig Start Date End Date Taking? Authorizing Provider  amLODipine (NORVASC) 5 MG tablet Take 1 tablet (5 mg total) by mouth daily. NEEDS LABS AND APPOINTMENT 11/08/18  Yes Delia Chimes A, MD  aspirin EC 81 MG tablet Take 81 mg by mouth daily. 07/30/20  Yes [provider]  atorvastatin (LIPITOR) 40 MG tablet Take 1 tablet (40 mg total) by mouth daily at 6 PM. NEEDS APPOINTMENT 11/08/18  Yes Delia Chimes A, MD  losartan (COZAAR) 50 MG tablet Take 1 tablet (50 mg total) by mouth daily. NEEDS APPOINTMENT. NEEDS LABS. 11/08/18  Yes Stallings, Zoe A, MD  metFORMIN (GLUCOPHAGE) 500 MG tablet Take 500 mg by mouth  daily.   Yes [provider]  meloxicam (MOBIC) 15 MG tablet Take 15 mg by mouth daily. 05/30/22   [provider]    Current Outpatient Medications  Medication Sig Dispense Refill   amLODipine (NORVASC) 5 MG tablet Take 1 tablet (5 mg total) by mouth daily. NEEDS LABS AND APPOINTMENT 30 tablet 0   aspirin EC 81 MG tablet Take 81 mg by mouth daily.     atorvastatin (LIPITOR) 40 MG tablet Take 1 tablet (40 mg total) by mouth daily at 6 PM. NEEDS APPOINTMENT 30 tablet 0   losartan (COZAAR) 50 MG tablet Take 1 tablet (50 mg total) by mouth daily. NEEDS APPOINTMENT. NEEDS LABS. 30 tablet 0   metFORMIN (GLUCOPHAGE) 500 MG tablet Take 500 mg by mouth daily.     meloxicam (MOBIC) 15 MG tablet Take 15 mg by mouth daily.     Current Facility-Administered Medications  Medication Dose Route Frequency Provider Last Rate Last Admin   0.9 %  sodium chloride infusion  500 mL Intravenous Once Chosen Geske, Carlota Raspberry, MD        Allergies as of 06/21/2022   (No Known Allergies)    Family History  Problem Relation Age of Onset   Heart disease Mother    Leukemia Brother    Heart attack Brother    Colon cancer Neg Hx    Colon  polyps Neg Hx    Esophageal cancer Neg Hx    Rectal cancer Neg Hx    Stomach cancer Neg Hx     Social History   Socioeconomic History   Marital status: Single    Spouse name: Not on file   Number of children: 5   Years of education: Not on file   Highest education level: Not on file  Occupational History   Not on file  Tobacco Use   Smoking status: Former    Packs/day: 0.50    Years: 10.00    Total pack years: 5.00    Types: Cigarettes    Quit date: 1    Years since quitting: 35.1   Smokeless tobacco: Never   Tobacco comments:    "quit smoking in the 1980s"  Vaping Use   Vaping Use: Never used  Substance and Sexual Activity   Alcohol use: Not Currently    Comment: 01/09/2017 "once/year"   Drug use: Not Currently    Types: Cocaine     Comment: "went thru treatment back in the 1980s"   Sexual activity: Yes  Other Topics Concern   Not on file  Social History Narrative   Lives at home w/ children   Right-handed   No caffeine      Social Determinants of Radio broadcast assistant Strain: Not on file  Food Insecurity: Not on file  Transportation Needs: Not on file  Physical Activity: Not on file  Stress: Not on file  Social Connections: Not on file  Intimate Partner Violence: Not on file    Review of Systems: All other review of systems negative except as mentioned in the HPI.  Physical Exam: Vital signs BP (!) 160/100   Pulse 68   Temp 97.6 F (36.4 C) (Temporal)   Resp 16   Ht 5' 6"$  (1.676 m)   Wt 210 lb (95.3 kg)   SpO2 98%   BMI 33.89 kg/m   General:   Alert,  Well-developed, pleasant and cooperative in NAD Lungs:  Clear throughout to auscultation.   Heart:  Regular rate and rhythm Abdomen:  Soft, nontender and nondistended.   Neuro/Psych:  Alert and cooperative. Normal mood and affect. A and O x 3  Jolly Mango, MD [facility]

## 2022-06-21 NOTE — Progress Notes (Signed)
Report to PACU, RN, vss, BBS= Clear.  

## 2022-06-21 NOTE — Op Note (Signed)
[facility] Patient Name: Alex Hunter Procedure Date: 06/21/2022 8:35 AM MRN: [id number] Endoscopist: Remo Lipps P. Havery Moros , MD, [id number] Age: 67 Referring MD:  Date of Birth: 1955/06/13 Gender: Male Account #: [id number] Procedure:                Colonoscopy Indications:              Screening for colorectal malignant neoplasm Medicines:                Monitored Anesthesia Care Procedure:                Pre-Anesthesia Assessment:                           - Prior to the procedure, a History and Physical                            was performed, and patient medications and                            allergies were reviewed. The patient's tolerance of                            previous anesthesia was also reviewed. The risks                            and benefits of the procedure and the sedation                            options and risks were discussed with the patient.                            All questions were answered, and informed consent                            was obtained. Prior Anticoagulants: The patient has                            taken no anticoagulant or antiplatelet agents. ASA                            Grade Assessment: II - A patient with mild systemic                            disease. After reviewing the risks and benefits,                            the patient was deemed in satisfactory condition to                            undergo the procedure.                           After obtaining informed consent, the colonoscope  was passed under direct vision. Throughout the                            procedure, the patient's blood pressure, pulse, and                            oxygen saturations were monitored continuously. The                            Olympus CF-HQ190L SN [id number] was introduced through                            the anus and advanced to the the cecum, identified                            by  appendiceal orifice and ileocecal valve. The                            colonoscopy was performed without difficulty. The                            patient tolerated the procedure well. The quality                            of the bowel preparation was good. The ileocecal                            valve, appendiceal orifice, and rectum were                            photographed. Scope In: 8:43:32 AM Scope Out: 9:02:06 AM Scope Withdrawal Time: 0 hours 16 minutes 34 seconds  Total Procedure Duration: 0 hours 18 minutes 34 seconds  Findings:                 The perianal and digital rectal examinations were                            normal.                           A 5 mm polyp was found in the ascending colon. The                            polyp was flat. The polyp was removed with a cold                            snare. Resection and retrieval were complete.                           A 3 mm polyp was found in the ascending colon. The                            polyp was sessile. The polyp was removed  with a                            cold snare. Resection and retrieval were complete.                           A 3 mm polyp was found in the descending colon. The                            polyp was sessile. The polyp was removed with a                            cold snare. Resection and retrieval were complete.                           Internal hemorrhoids were found during                            retroflexion. The hemorrhoids were small.                           The exam was otherwise without abnormality. Complications:            No immediate complications. Estimated blood loss:                            Minimal. Estimated Blood Loss:     Estimated blood loss was minimal. Impression:               - One 5 mm polyp in the ascending colon, removed                            with a cold snare. Resected and retrieved.                           - One 3 mm polyp in the ascending  colon, removed                            with a cold snare. Resected and retrieved.                           - One 3 mm polyp in the descending colon, removed                            with a cold snare. Resected and retrieved.                           - Internal hemorrhoids.                           - The examination was otherwise normal. Recommendation:           - Patient has a contact number available for  emergencies. The signs and symptoms of potential                            delayed complications were discussed with the                            patient. Return to normal activities tomorrow.                            Written discharge instructions were provided to the                            patient.                           - Resume previous diet.                           - Continue present medications.                           - Await pathology results. Remo Lipps P. Havery Moros, MD 06/21/2022 9:06:17 AM This report has been signed electronically.

## 2022-06-22 ENCOUNTER — Telehealth: Payer: Self-pay | Admitting: *Deleted

## 2022-06-22 NOTE — Telephone Encounter (Signed)
Patient returned call, stating he is doing well.

## 2022-06-22 NOTE — Telephone Encounter (Signed)
No answer on follow up call. No voice message left

## 2022-06-27 ENCOUNTER — Encounter: Payer: Self-pay | Admitting: Gastroenterology

## 2022-07-01 ENCOUNTER — Telehealth: Payer: Self-pay | Admitting: Gastroenterology

## 2022-07-01 NOTE — Telephone Encounter (Signed)
PT is calling to discuss the results of his colonoscopy from 2/20. Please advise

## 2022-07-01 NOTE — Telephone Encounter (Signed)
Returned call to patient. I informed patient that a letter was mailed on 06/27/22,  but he probably hasn't received it in the mail yet. We reviewed the pathology letter and recommendations. Pt verbalized understanding and had no concerns at the end of the call.

## 2022-07-11 ENCOUNTER — Ambulatory Visit: Payer: Medicare HMO | Admitting: Physician Assistant

## 2022-07-12 ENCOUNTER — Other Ambulatory Visit: Payer: Self-pay

## 2022-08-08 ENCOUNTER — Ambulatory Visit: Payer: Medicare HMO | Admitting: Physician Assistant

## 2022-09-05 ENCOUNTER — Telehealth: Payer: Self-pay | Admitting: Physician Assistant

## 2022-09-05 ENCOUNTER — Telehealth: Payer: Self-pay | Admitting: Orthopaedic Surgery

## 2022-09-05 MED ORDER — CYCLOBENZAPRINE HCL 5 MG PO TABS: 5.0000 mg | ORAL_TABLET | Freq: Three times a day (TID) | ORAL | 0 refills | Status: DC | PRN

## 2022-09-05 NOTE — Telephone Encounter (Signed)
Patient would like handicap placard please advise 

## 2022-09-05 NOTE — Telephone Encounter (Signed)
Patient called. He would like flexeril called in. His call back number is [phone]

## 2022-09-05 NOTE — Telephone Encounter (Signed)
Left at front desk downstairs Patient aware

## 2022-10-05 ENCOUNTER — Other Ambulatory Visit: Payer: Self-pay

## 2022-10-05 ENCOUNTER — Ambulatory Visit: Payer: Medicare HMO | Admitting: Sports Medicine

## 2022-10-05 ENCOUNTER — Encounter: Payer: Self-pay | Admitting: Sports Medicine

## 2022-10-05 DIAGNOSIS — M1612 Unilateral primary osteoarthritis, left hip: Secondary | ICD-10-CM | POA: Diagnosis not present

## 2022-10-05 DIAGNOSIS — G8929 Other chronic pain: Secondary | ICD-10-CM | POA: Diagnosis not present

## 2022-10-05 DIAGNOSIS — Z96641 Presence of right artificial hip joint: Secondary | ICD-10-CM

## 2022-10-05 DIAGNOSIS — M2170 Unequal limb length (acquired), unspecified site: Secondary | ICD-10-CM | POA: Diagnosis not present

## 2022-10-05 DIAGNOSIS — M25552 Pain in left hip: Secondary | ICD-10-CM | POA: Diagnosis not present

## 2022-10-05 MED ORDER — METHYLPREDNISOLONE ACETATE 40 MG/ML IJ SUSP
40.0000 mg | INTRAMUSCULAR | Status: AC | PRN
  Administered 2022-10-05: 40 mg via INTRA_ARTICULAR

## 2022-10-05 MED ORDER — CYCLOBENZAPRINE HCL 5 MG PO TABS: 5.0000 mg | ORAL_TABLET | Freq: Every day | ORAL | 0 refills | Status: DC

## 2022-10-05 MED ORDER — LIDOCAINE HCL 1 % IJ SOLN
4.0000 mL | INTRAMUSCULAR | Status: AC | PRN
  Administered 2022-10-05: 4 mL

## 2022-10-05 NOTE — Progress Notes (Signed)
Here today wanting another hip injection

## 2022-10-05 NOTE — Progress Notes (Signed)
[facility] - 67 y.o. male MRN [id number]  Date of birth: 02/15/1956  Office Visit Note: Visit Date: 10/05/2022 PCP: Melida Quitter, MD Referred by: Melida Quitter, MD  Subjective: Chief Complaint  Patient presents with   Left Hip - Pain   HPI: Alex Hunter is a pleasant 66 y.o. male who presents today for acute on chronic left hip pain.  Trino has advanced arthritis of the left hip, he is status post right hip replacement years ago.  Back in January 2024 we did proceed with intra-articular hip injection which gave him excellent relief of his pain for many months.  Over the last 1.5 weeks he has noticed that the hip pain has flared back up and is giving him issues.  Notes some pain and restriction in range of motion.  He is considering having the left hip replaced with Dr. Magnus Ivan later in the summer to work in his job settles down.  He does continue with a shoe insert modification to help correct his leg length discrepancy, states this helps his gait.  For the hip and the back, he has taken Flexeril 5 mg at bedtime only which does help relieve his muscle tightness, he is asking for refill today.  Pertinent ROS were reviewed with the patient and found to be negative unless otherwise specified above in HPI.   Assessment & Plan: Visit Diagnoses:  1. Unilateral primary osteoarthritis, left hip   2. Leg length discrepancy   3. Chronic left hip pain   4. History of total right hip replacement    Plan: Discussed with Alex Hunter that he is experiencing an exacerbation of his chronic advanced left hip osteoarthritis.  Previous injection from January gave him excellent relief although his pain was exacerbated about 1.5 weeks ago.  He has been continuing to be busy at work.  Through shared decision-making, elected to proceed with ultrasound-guided intra-articular hip injection, he had rather immediate relief of pain either from the anesthetic portion minutes after the injection.  Allow for  48 hours of modified rest and activity, may use ice and over-the-counter anti-inflammatories for pain control.  We did refill his Flexeril that he may take 5 mg nightly as needed to help with his muscle tightness and pain in the hip/back.  Discussed with him if he is planning on proceeding with hip replacement, he would need to wait at least 6 weeks from planned hip replacement per Dr. Eliberto Ivory protocol.  He is agreeable and understanding.  We did fill out a handicap placard for 6 months for him today.  Continue in his shoe lift insert to help correct his leg length discrepancy.  Follow-up: Return if symptoms worsen or fail to improve, for for hip OA.   Meds & Orders:  Meds ordered this encounter  Medications   cyclobenzaprine (FLEXERIL) 5 MG tablet    Sig: Take 1-2 tablets (5-10 mg total) by mouth at bedtime.    Dispense:  60 tablet    Refill:  0    Orders Placed This Encounter  Procedures   Large Joint Inj   US Guided Needle Placement - No Linked Charges     Procedures: Large Joint Inj: L hip joint on 10/05/2022 11:04 AM Indications: pain Details: 22 G 3.5 in needle, ultrasound-guided anterior approach Medications: 4 mL lidocaine 1 %; 40 mg methylPREDNISolone acetate 40 MG/ML Outcome: tolerated well, no immediate complications  Procedure: US-guided intra-articular hip injection, left After discussion on risks/benefits/indications and informed verbal consent was  obtained, a timeout was performed. Patient was lying supine on exam table. The hip was cleaned with betadine and alcohol swabs. Then utilizing ultrasound guidance, the patient's femoral head and neck junction was identified and subsequently injected with 4:1 lidocaine:depomedrol via an in-plane approach with ultrasound visualization of the injectate administered into the hip joint. Patient tolerated procedure well without immediate complications.  Procedure, treatment alternatives, risks and benefits explained, specific risks  discussed. Consent was given by the patient. Immediately prior to procedure a time out was called to verify the correct patient, procedure, equipment, support staff and site/side marked as required. Patient was prepped and draped in the usual sterile fashion.          Clinical History: No specialty comments available.  He reports that he quit smoking about 35 years ago. His smoking use included cigarettes. He has a 5.00 pack-year smoking history. He has never used smokeless tobacco. No results for input(s): "HGBA1C", "LABURIC" in the last 8760 hours.  Objective:   Vital Signs: There were no vitals taken for this visit.  Physical Exam  Gen: Well-appearing, in no acute distress; non-toxic CV:  Well-perfused. Warm.  Resp: Breathing unlabored on room air; no wheezing. Psych: Fluid speech in conversation; appropriate affect; normal thought process Neuro: Sensation intact throughout. No gross coordination deficits.   Ortho Exam - Left hip: No lateral trochanter TTP.  No overlying swelling or redness.  There is limited internal rotation and associated pain with FADIR testing.  Left leg is about three quarters of an inch shorter than the right leg, his gait is neutral with his shoe insert modification.  Imaging:  XR 01/31/22: An AP pelvis and lateral left hip shows worsening arthritic changes in the  left hip when compared to films from 2018.  There is cystic changes in the  acetabulum and femoral head as well as evidence of femoral acetabular  impingement and superior lateral joint space narrowing.   Past Medical/Family/Surgical/Social History: Medications & Allergies reviewed per EMR, new medications updated. Patient Active Problem List   Diagnosis Date Noted   Trochanteric bursitis of left hip 06/03/2017   Osteoarthritis of right hip 01/09/2017   Essential hypertension 03/06/2012   Perirectal abscess 03/06/2012   Hyperlipidemia 11/19/2009   Past Medical History:  Diagnosis Date    Anxiety    Arthritis    "right hip" (01/09/2017)   Asthma    Diabetes mellitus without complication (HCC)    High blood pressure    High cholesterol    Family History  Problem Relation Age of Onset   Heart disease Mother    Leukemia Brother    Heart attack Brother    Colon cancer Neg Hx    Colon polyps Neg Hx    Esophageal cancer Neg Hx    Rectal cancer Neg Hx    Stomach cancer Neg Hx    Past Surgical History:  Procedure Laterality Date   COLONOSCOPY  06/21/2022   COLONOSCOPY  09/03/2009   F/V, Melvia Heaps, excellent prep, normal   DENTAL SURGERY     "had an implant" (01/09/2017)   JOINT REPLACEMENT     TOTAL HIP ARTHROPLASTY Right 01/09/2017   TOTAL HIP ARTHROPLASTY Right 01/09/2017   Procedure: RIGHT TOTAL HIP ARTHROPLASTY ANTERIOR APPROACH;  Surgeon: Samson Frederic, MD;  Location: [facility] OR;  Service: Orthopedics;  Laterality: Right;   Social History   Occupational History   Not on file  Tobacco Use   Smoking status: Former    Packs/day: 0.50  Years: 10.00    Additional pack years: 0.00    Total pack years: 5.00    Types: Cigarettes    Quit date: 31    Years since quitting: 35.4   Smokeless tobacco: Never   Tobacco comments:    "quit smoking in the 1980s"  Vaping Use   Vaping Use: Never used  Substance and Sexual Activity   Alcohol use: Not Currently    Comment: 01/09/2017 "once/year"   Drug use: Not Currently    Types: Cocaine    Comment: "went thru treatment back in the 1980s"   Sexual activity: Yes

## 2022-10-27 DIAGNOSIS — I10 Essential (primary) hypertension: Secondary | ICD-10-CM | POA: Diagnosis not present

## 2022-10-27 DIAGNOSIS — E669 Obesity, unspecified: Secondary | ICD-10-CM | POA: Diagnosis not present

## 2022-10-27 DIAGNOSIS — Z713 Dietary counseling and surveillance: Secondary | ICD-10-CM | POA: Diagnosis not present

## 2022-10-27 DIAGNOSIS — Z6833 Body mass index (BMI) 33.0-33.9, adult: Secondary | ICD-10-CM | POA: Diagnosis not present

## 2022-10-27 DIAGNOSIS — M169 Osteoarthritis of hip, unspecified: Secondary | ICD-10-CM | POA: Diagnosis not present

## 2022-10-27 DIAGNOSIS — E1159 Type 2 diabetes mellitus with other circulatory complications: Secondary | ICD-10-CM | POA: Diagnosis not present

## 2022-10-27 DIAGNOSIS — E785 Hyperlipidemia, unspecified: Secondary | ICD-10-CM | POA: Diagnosis not present

## 2022-12-01 ENCOUNTER — Encounter (HOSPITAL_BASED_OUTPATIENT_CLINIC_OR_DEPARTMENT_OTHER): Payer: Self-pay

## 2022-12-01 ENCOUNTER — Other Ambulatory Visit: Payer: Self-pay

## 2022-12-01 ENCOUNTER — Emergency Department (HOSPITAL_BASED_OUTPATIENT_CLINIC_OR_DEPARTMENT_OTHER)
Admission: EM | Admit: 2022-12-01 | Discharge: 2022-12-01 | Disposition: A | Discharge status: Home / Self Care | Payer: Medicare HMO | Attending: Emergency Medicine | Admitting: Emergency Medicine

## 2022-12-01 ENCOUNTER — Emergency Department (HOSPITAL_BASED_OUTPATIENT_CLINIC_OR_DEPARTMENT_OTHER): Payer: Medicare HMO

## 2022-12-01 DIAGNOSIS — Z7982 Long term (current) use of aspirin: Secondary | ICD-10-CM | POA: Diagnosis not present

## 2022-12-01 DIAGNOSIS — I10 Essential (primary) hypertension: Secondary | ICD-10-CM | POA: Diagnosis not present

## 2022-12-01 DIAGNOSIS — J45909 Unspecified asthma, uncomplicated: Secondary | ICD-10-CM | POA: Insufficient documentation

## 2022-12-01 DIAGNOSIS — Z7984 Long term (current) use of oral hypoglycemic drugs: Secondary | ICD-10-CM | POA: Diagnosis not present

## 2022-12-01 DIAGNOSIS — N23 Unspecified renal colic: Secondary | ICD-10-CM | POA: Diagnosis not present

## 2022-12-01 DIAGNOSIS — R932 Abnormal findings on diagnostic imaging of liver and biliary tract: Secondary | ICD-10-CM | POA: Diagnosis not present

## 2022-12-01 DIAGNOSIS — Z87891 Personal history of nicotine dependence: Secondary | ICD-10-CM | POA: Diagnosis not present

## 2022-12-01 DIAGNOSIS — E119 Type 2 diabetes mellitus without complications: Secondary | ICD-10-CM | POA: Insufficient documentation

## 2022-12-01 DIAGNOSIS — K429 Umbilical hernia without obstruction or gangrene: Secondary | ICD-10-CM | POA: Diagnosis not present

## 2022-12-01 DIAGNOSIS — Z79899 Other long term (current) drug therapy: Secondary | ICD-10-CM | POA: Diagnosis not present

## 2022-12-01 DIAGNOSIS — N281 Cyst of kidney, acquired: Secondary | ICD-10-CM | POA: Diagnosis not present

## 2022-12-01 DIAGNOSIS — R109 Unspecified abdominal pain: Secondary | ICD-10-CM | POA: Diagnosis present

## 2022-12-01 DIAGNOSIS — N132 Hydronephrosis with renal and ureteral calculous obstruction: Secondary | ICD-10-CM | POA: Diagnosis not present

## 2022-12-01 HISTORY — DX: Type 2 diabetes mellitus without complications: E11.9

## 2022-12-01 LAB — CBC WITH DIFFERENTIAL/PLATELET
Abs Immature Granulocytes: 0.02 10*3/uL (ref 0.00–0.07)
Basophils Absolute: 0.1 10*3/uL (ref 0.0–0.1)
Basophils Relative: 1 %
Eosinophils Absolute: 0.2 10*3/uL (ref 0.0–0.5)
Eosinophils Relative: 3 %
HCT: 46.1 % (ref 39.0–52.0)
Hemoglobin: 14.9 g/dL (ref 13.0–17.0)
Immature Granulocytes: 0 %
Lymphocytes Relative: 36 %
Lymphs Abs: 2.5 10*3/uL (ref 0.7–4.0)
MCH: 27.2 pg (ref 26.0–34.0)
MCHC: 32.3 g/dL (ref 30.0–36.0)
MCV: 84.1 fL (ref 80.0–100.0)
Monocytes Absolute: 0.6 10*3/uL (ref 0.1–1.0)
Monocytes Relative: 9 %
Neutro Abs: 3.6 10*3/uL (ref 1.7–7.7)
Neutrophils Relative %: 51 %
Platelets: 231 10*3/uL (ref 150–400)
RBC: 5.48 MIL/uL (ref 4.22–5.81)
RDW: 15 % (ref 11.5–15.5)
WBC: 6.9 10*3/uL (ref 4.0–10.5)
nRBC: 0 % (ref 0.0–0.2)

## 2022-12-01 LAB — COMPREHENSIVE METABOLIC PANEL
ALT: 30 U/L (ref 0–44)
AST: 22 U/L (ref 15–41)
Albumin: 4.1 g/dL (ref 3.5–5.0)
Alkaline Phosphatase: 84 U/L (ref 38–126)
Anion gap: 9 (ref 5–15)
BUN: 16 mg/dL (ref 8–23)
CO2: 27 mmol/L (ref 22–32)
Calcium: 8.9 mg/dL (ref 8.9–10.3)
Chloride: 104 mmol/L (ref 98–111)
Creatinine, Ser: 1.11 mg/dL (ref 0.61–1.24)
GFR, Estimated: >60 mL/min (ref >60)
Glucose, Bld: 116 mg/dL — ABNORMAL HIGH (ref 70–99)
Potassium: 3.8 mmol/L (ref 3.5–5.1)
Sodium: 140 mmol/L (ref 135–145)
Total Bilirubin: 0.3 mg/dL (ref 0.3–1.2)
Total Protein: 7 g/dL (ref 6.5–8.1)

## 2022-12-01 LAB — URINALYSIS, ROUTINE W REFLEX MICROSCOPIC
Bilirubin Urine: NEGATIVE
Glucose, UA: NEGATIVE mg/dL
Hgb urine dipstick: NEGATIVE
Ketones, ur: NEGATIVE mg/dL
Leukocytes,Ua: NEGATIVE
Nitrite: NEGATIVE
Specific Gravity, Urine: 1.023 (ref 1.005–1.030)
pH: 7 (ref 5.0–8.0)

## 2022-12-01 MED ORDER — TAMSULOSIN HCL 0.4 MG PO CAPS: ORAL_CAPSULE | ORAL | 0 refills | Status: DC

## 2022-12-01 MED ORDER — OXYCODONE-ACETAMINOPHEN 10-325 MG PO TABS: 1.0000 | ORAL_TABLET | ORAL | 0 refills | Status: DC | PRN

## 2022-12-01 MED ORDER — TAMSULOSIN HCL 0.4 MG PO CAPS
0.4000 mg | ORAL_CAPSULE | Freq: Once | ORAL | Status: AC
  Administered 2022-12-01: 0.4 mg via ORAL
  Filled 2022-12-01: qty 1

## 2022-12-01 MED ORDER — ONDANSETRON 8 MG PO TBDP: 8.0000 mg | ORAL_TABLET | Freq: Three times a day (TID) | ORAL | 1 refills | Status: DC | PRN

## 2022-12-01 NOTE — ED Notes (Signed)
Patient transported to CT 

## 2022-12-01 NOTE — ED Triage Notes (Addendum)
POV from home, A&O x 4, GCS 15, amb to triage  Left sided flank and abd pain that started tonight, resolved since coming in but pt sts comes and goes, denies urinary or NVD. Pt comfortable in triage.

## 2022-12-01 NOTE — ED Notes (Signed)
Back from ct.

## 2022-12-01 NOTE — ED Provider Notes (Signed)
[facility] Provider Note: Lowella Dell, MD, FACEP  CSN: [id number] MRN: [id number] ARRIVAL: 12/01/22 at 0259 ROOM: DB013/DB013   CHIEF COMPLAINT  Flank Pain   HISTORY OF PRESENT ILLNESS  12/01/22 3:39 AM Alex Hunter is a 67 y.o. male with left-sided flank pain that awakened him from sleep earlier this morning.  It has been coming episodically, lasting about 15 minutes at a time.  It is moderate to severe when it occurs.  He is equivocal about whether movement makes it worse or better.  He does not have associated nausea or vomiting and has not noticed urinary symptoms.   Past Medical History:  Diagnosis Date   Anxiety    Arthritis    "right hip" (01/09/2017)   Asthma    DM (diabetes mellitus) (HCC)    High blood pressure    High cholesterol     Past Surgical History:  Procedure Laterality Date   COLONOSCOPY  06/21/2022   COLONOSCOPY  09/03/2009   F/V, Melvia Heaps, excellent prep, normal   DENTAL SURGERY     "had an implant" (01/09/2017)   JOINT REPLACEMENT     TOTAL HIP ARTHROPLASTY Right 01/09/2017   TOTAL HIP ARTHROPLASTY Right 01/09/2017   Procedure: RIGHT TOTAL HIP ARTHROPLASTY ANTERIOR APPROACH;  Surgeon: Samson Frederic, MD;  Location: [facility] OR;  Service: Orthopedics;  Laterality: Right;    Family History  Problem Relation Age of Onset   Heart disease Mother    Leukemia Brother    Heart attack Brother    Colon cancer Neg Hx    Colon polyps Neg Hx    Esophageal cancer Neg Hx    Rectal cancer Neg Hx    Stomach cancer Neg Hx     Social History   Tobacco Use   Smoking status: Former    Current packs/day: 0.00    Average packs/day: 0.5 packs/day for 10.0 years (5.0 ttl pk-yrs)    Types: Cigarettes    Start date: 75    Quit date: 1989    Years since quitting: 35.6   Smokeless tobacco: Never   Tobacco comments:    "quit smoking in the 1980s"  Vaping Use   Vaping status: Never Used  Substance Use Topics   Alcohol use: Not Currently     Comment: 01/09/2017 "once/year"   Drug use: Not Currently    Types: Cocaine    Comment: "went thru treatment back in the 1980s"    Prior to Admission medications   Medication Sig Start Date End Date Taking? Authorizing Provider  ondansetron (ZOFRAN-ODT) 8 MG disintegrating tablet Take 1 tablet (8 mg total) by mouth every 8 (eight) hours as needed for nausea or vomiting. 12/01/22  Yes Ila Landowski, MD  oxyCODONE-acetaminophen (PERCOCET) 10-325 MG tablet Take 1 tablet by mouth every 4 (four) hours as needed for pain (may cause constipation). 12/01/22  Yes Julyan Gales, MD  tamsulosin (FLOMAX) 0.4 MG CAPS capsule Take 1 capsule daily until stone passes. 12/01/22  Yes Rajohn Henery, MD  amLODipine (NORVASC) 5 MG tablet Take 1 tablet (5 mg total) by mouth daily. NEEDS LABS AND APPOINTMENT 11/08/18   Doristine Bosworth, MD  aspirin EC 81 MG tablet Take 81 mg by mouth daily. 07/30/20   [provider]  atorvastatin (LIPITOR) 40 MG tablet Take 1 tablet (40 mg total) by mouth daily at 6 PM. NEEDS APPOINTMENT 11/08/18   Doristine Bosworth, MD  cyclobenzaprine (FLEXERIL) 5 MG tablet Take 1-2 tablets (5-10 mg total) by  mouth at bedtime. 10/05/22   Madelyn Brunner, DO  losartan (COZAAR) 50 MG tablet Take 1 tablet (50 mg total) by mouth daily. NEEDS APPOINTMENT. NEEDS LABS. 11/08/18   Doristine Bosworth, MD  meloxicam (MOBIC) 15 MG tablet Take 15 mg by mouth daily. 05/30/22   [provider]  metFORMIN (GLUCOPHAGE) 500 MG tablet Take 500 mg by mouth daily.    [provider]    Allergies Patient has no known allergies.   REVIEW OF SYSTEMS  Negative except as noted here or in the History of Present Illness.   PHYSICAL EXAMINATION  Initial Vital Signs Blood pressure (!) 160/94, pulse 77, temperature 97.8 F (36.6 C), resp. rate 17, height 5\' 6"  (1.676 m), weight 96.2 kg, SpO2 97%.  Examination General: Well-developed, well-nourished male in no acute distress; appearance consistent with age of  record HENT: normocephalic; atraumatic Eyes: Normal appearance Neck: supple Heart: regular rate and rhythjm Lungs: clear to auscultation bilaterally Abdomen: soft; nondistended; nontender; bowel sounds present GU: No CVA tenderness Extremities: No deformity; full range of motion Neurologic: Awake, alert and oriented; motor function intact in all extremities and symmetric; no facial droop Skin: Warm and dry Psychiatric: Normal mood and affect   RESULTS  Summary of this visit's results, reviewed and interpreted by myself:   EKG Interpretation Date/Time:    Ventricular Rate:    PR Interval:    QRS Duration:    QT Interval:    QTC Calculation:   R Axis:      Text Interpretation:         Laboratory Studies: Results for orders placed or performed during the hospital encounter of 12/01/22 (from the past 24 hour(s))  CBC with Differential     Status: None   Collection Time: 12/01/22  3:21 AM  Result Value Ref Range   WBC 6.9 4.0 - 10.5 K/uL   RBC 5.48 4.22 - 5.81 MIL/uL   Hemoglobin 14.9 13.0 - 17.0 g/dL   HCT 16.1 09.6 - 04.5 %   MCV 84.1 80.0 - 100.0 fL   MCH 27.2 26.0 - 34.0 pg   MCHC 32.3 30.0 - 36.0 g/dL   RDW 40.9 81.1 - 91.4 %   Platelets 231 150 - 400 K/uL   nRBC 0.0 0.0 - 0.2 %   Neutrophils Relative % 51 %   Neutro Abs 3.6 1.7 - 7.7 K/uL   Lymphocytes Relative 36 %   Lymphs Abs 2.5 0.7 - 4.0 K/uL   Monocytes Relative 9 %   Monocytes Absolute 0.6 0.1 - 1.0 K/uL   Eosinophils Relative 3 %   Eosinophils Absolute 0.2 0.0 - 0.5 K/uL   Basophils Relative 1 %   Basophils Absolute 0.1 0.0 - 0.1 K/uL   Immature Granulocytes 0 %   Abs Immature Granulocytes 0.02 0.00 - 0.07 K/uL  Comprehensive metabolic panel     Status: Abnormal   Collection Time: 12/01/22  3:21 AM  Result Value Ref Range   Sodium 140 135 - 145 mmol/L   Potassium 3.8 3.5 - 5.1 mmol/L   Chloride 104 98 - 111 mmol/L   CO2 27 22 - 32 mmol/L   Glucose, Bld 116 (H) 70 - 99 mg/dL   BUN 16 8 - 23  mg/dL   Creatinine, Ser 7.82 0.61 - 1.24 mg/dL   Calcium 8.9 8.9 - 95.6 mg/dL   Total Protein 7.0 6.5 - 8.1 g/dL   Albumin 4.1 3.5 - 5.0 g/dL   AST 22 15 - 41  U/L   ALT 30 0 - 44 U/L   Alkaline Phosphatase 84 38 - 126 U/L   Total Bilirubin 0.3 0.3 - 1.2 mg/dL   GFR, Estimated >96 >29 mL/min   Anion gap 9 5 - 15  Urinalysis, Routine w reflex microscopic -Urine, Clean Catch     Status: Abnormal   Collection Time: 12/01/22  3:21 AM  Result Value Ref Range   Color, Urine YELLOW YELLOW   APPearance CLEAR CLEAR   Specific Gravity, Urine 1.023 1.005 - 1.030   pH 7.0 5.0 - 8.0   Glucose, UA NEGATIVE NEGATIVE mg/dL   Hgb urine dipstick NEGATIVE NEGATIVE   Bilirubin Urine NEGATIVE NEGATIVE   Ketones, ur NEGATIVE NEGATIVE mg/dL   Protein, ur TRACE (A) NEGATIVE mg/dL   Nitrite NEGATIVE NEGATIVE   Leukocytes,Ua NEGATIVE NEGATIVE   Imaging Studies: CT Renal Stone Study  Result Date: 12/01/2022 CLINICAL DATA:  Left-sided flank pain and abdominal pain. EXAM: CT ABDOMEN AND PELVIS WITHOUT CONTRAST TECHNIQUE: Multidetector CT imaging of the abdomen and pelvis was performed following the standard protocol without IV contrast. RADIATION DOSE REDUCTION: This exam was performed according to the departmental dose-optimization program which includes automated exposure control, adjustment of the mA and/or kV according to patient size and/or use of iterative reconstruction technique. COMPARISON:  None Available. FINDINGS: Lower chest: No acute abnormality. Hepatobiliary: Scattered subcentimeter hypodensities are noted in the liver which are too small to further characterize, however statistically likely represent cysts or hemangiomas. No biliary ductal dilatation. The gallbladder is without stones. Pancreas: Unremarkable. No pancreatic ductal dilatation or surrounding inflammatory changes. Spleen: Normal in size without focal abnormality. Adrenals/Urinary Tract: The adrenal glands are within normal limits. Cysts  are present in the right kidney. There is a hyperdense 9 mm structure in the mid left kidney, most likely hemorrhagic or proteinaceous cyst. Nonobstructive renal calculus is noted on the left. There is mild hydroureteronephrosis on the left with a 4 mm calculus in the distal left ureter, best seen on coronal image 49. No obstructive uropathy on the right. The bladder is unremarkable. Stomach/Bowel: Stomach is within normal limits. Appendix appears normal. No evidence of bowel wall thickening, distention, or inflammatory changes. No free air or pneumatosis. Vascular/Lymphatic: Aortic atherosclerosis. No enlarged abdominal or pelvic lymph nodes. Reproductive: Prostate is unremarkable. Other: No abdominopelvic ascites. Fat containing umbilical hernia is present. Musculoskeletal: Total hip arthroplasty changes are noted on the right. There are degenerative changes in the thoracolumbar spine and left hip. No acute osseous abnormality is seen. IMPRESSION: 1. Mild obstructive uropathy on the left with a 4 mm calculus in the distal left ureter. 2. Nonobstructive left renal calculus. 3. Aortic atherosclerosis. Electronically Signed   By: Thornell Sartorius M.D.   On: 12/01/2022 04:20    ED COURSE and MDM  Nursing notes, initial and subsequent vitals signs, including pulse oximetry, reviewed and interpreted by myself.  Vitals:   12/01/22 0306 12/01/22 0307  BP:  (!) 160/94  Pulse:  77  Resp:  17  Temp:  97.8 F (36.6 C)  SpO2:  97%  Weight: 96.2 kg   Height: 5\' 6"  (1.676 m)    Medications  tamsulosin (FLOMAX) capsule 0.4 mg (0.4 mg Oral Given 12/01/22 0430)    4:26 AM Patient is pain-free here in the department.  Presentation and CTs are consistent with ureteral colic.  He has 4 mm stone that should pass on its own but we will refer to urology if it does not pass in a  reasonable time.  PROCEDURES  Procedures   ED DIAGNOSES     ICD-10-CM   1. Ureteral colic  N23          Yvette Loveless, MD 12/01/22  [phone]

## 2022-12-08 ENCOUNTER — Telehealth: Payer: Self-pay

## 2022-12-08 NOTE — Telephone Encounter (Signed)
Transition Care Management Follow-up Telephone Call Date of discharge and from where: [facility] 8/1 How have you been since you were released from the hospital? Doing good Any questions or concerns? No  Items Reviewed: Did the pt receive and understand the discharge instructions provided? Yes  Medications obtained and verified? No  Other? No  Any new allergies since your discharge? No  Dietary orders reviewed? No Do you have support at home? Yes    Follow up appointments reviewed:  PCP Hospital f/u appt confirmed? No  Scheduled to see  on  @ . Specialist Hospital f/u appt confirmed? No  Scheduled to see  on  @ . Are transportation arrangements needed? Yes  If their condition worsens, is the pt aware to call PCP or go to the Emergency Dept.? Yes Was the patient provided with contact information for the PCP's office or ED? Yes Was to pt encouraged to call back with questions or concerns? Yes   Lenard Forth [facility] Guide, [facility] [phone] [address] [place], [place], [place] [address] Phone: [phone] Email: [email or web address]

## 2022-12-29 ENCOUNTER — Telehealth: Payer: Self-pay | Admitting: Sports Medicine

## 2022-12-29 ENCOUNTER — Ambulatory Visit: Payer: Medicare HMO | Admitting: Sports Medicine

## 2022-12-29 ENCOUNTER — Other Ambulatory Visit (INDEPENDENT_AMBULATORY_CARE_PROVIDER_SITE_OTHER): Payer: Medicare HMO

## 2022-12-29 ENCOUNTER — Encounter: Payer: Self-pay | Admitting: Sports Medicine

## 2022-12-29 DIAGNOSIS — M79671 Pain in right foot: Secondary | ICD-10-CM

## 2022-12-29 DIAGNOSIS — M722 Plantar fascial fibromatosis: Secondary | ICD-10-CM

## 2022-12-29 DIAGNOSIS — M1612 Unilateral primary osteoarthritis, left hip: Secondary | ICD-10-CM

## 2022-12-29 DIAGNOSIS — M7661 Achilles tendinitis, right leg: Secondary | ICD-10-CM

## 2022-12-29 DIAGNOSIS — M2170 Unequal limb length (acquired), unspecified site: Secondary | ICD-10-CM

## 2022-12-29 MED ORDER — METHYLPREDNISOLONE 4 MG PO TBPK: ORAL_TABLET | ORAL | 0 refills | Status: DC

## 2022-12-29 MED ORDER — CELECOXIB 100 MG PO CAPS: 100.0000 mg | ORAL_CAPSULE | Freq: Two times a day (BID) | ORAL | 0 refills | Status: AC

## 2022-12-29 NOTE — Telephone Encounter (Signed)
Pt called requesting Dr Shon Baton to change his script sent in today to [facility] on [address]. His pharmacy is out of stock. Please call pt when changed to [phone]

## 2022-12-29 NOTE — Progress Notes (Signed)
Patient reports foot pain on the bottom of the heel beginning about a month ago. He states that it just started one day, no mechanism of injury. He says that pain is worse after bathing or showering, and typically feels worse when first walking around in the morning. Patient denies popping, numbness, tingling.  Patient was instructed in 10 minutes of therapeutic exercises for right foot pain to improve strength, ROM and function according to my instructions and plan of care by a Certified Athletic Trainer during the office visit. A customized handout was provided and demonstration of proper technique shown and discussed. Patient did perform exercises and demonstrate understanding through teachback.  All questions discussed and answered.

## 2022-12-29 NOTE — Progress Notes (Signed)
Alex Hunter - 67 y.o. male MRN [id number]  Date of birth: Feb 19, 1956  Office Visit Note: Visit Date: 12/29/2022 PCP: Melida Quitter, MD Referred by: Melida Quitter, MD  Subjective: Chief Complaint  Patient presents with   Right Foot - Pain   HPI: Alex Hunter is a pleasant 67 y.o. male who presents today for right heel pain x 1 month. F/u left hip OA.  Left hip - last US-guided IA injection was 10/05/22 - did well with this, but now pain returning. Taking Meloxicam 15mg  but not helping as much.  Right foot/heel -he is having pain on the plantar aspect of the heel, this is worse with for steps in the morning.  It will then improve as he walks but then worsened later in the day.  He also has some tenderness over the distal Achilles.  Denies any redness, swelling.  No numbness or tingling of bilateral feet.  He does wear a shoe insert in the left leg given his leg length discrepancy.  This has been bothering him for about a month or so.  Does use meloxicam for his hip but no other topical or oral medication.  Pertinent ROS were reviewed with the patient and found to be negative unless otherwise specified above in HPI.   Assessment & Plan: Visit Diagnoses:  1. Pain in right foot   2. Unilateral primary osteoarthritis, left hip   3. Leg length discrepancy   4. Plantar fasciitis of right foot   5. Achilles tendinitis, right leg    Plan: Alex Hunter is having an exacerbation of his chronic underlying left hip osteoarthritis.  He does note he will proceed with hip replacement at some point, although is not ready for this.  He would still need to wait a few weeks but would like to repeat intra-articular injection to help bridge the gap.  In terms of his right foot, this is a combination of plantar fasciitis as well as a degree of distal insertional Achilles tendinitis.  We did fit him for 5/16 inch heel lift to help offload the Achilles.  We did review plantar fascia and Achilles home  exercises which he will perform once daily.  A customized handout was provided for him and myocardial training did review these in the room with him.  We did discuss the role of extra for shockwave therapy.  We will treat him first with a 6-day Medrol Dosepak to help with the inflammatory change.  Meloxicam is not helping for him, he will discontinue this and we will transition to Celebrex 100 mg twice daily as needed, he will wait to start this until after he completes his prednisone.  Will follow-up in 2 weeks for left hip injection and we will follow-up on his right foot/heel.  Follow-up: Return in about 2 weeks (around 01/12/2023) for for L-hip [place] inj (30-min).   Meds & Orders:  Meds ordered this encounter  Medications   methylPREDNISolone (MEDROL DOSEPAK) 4 MG TBPK tablet    Sig: Take per packet instructions. Taper dosing.    Dispense:  1 each    Refill:  0   celecoxib (CELEBREX) 100 MG capsule    Sig: Take 1 capsule (100 mg total) by mouth 2 (two) times daily.    Dispense:  60 capsule    Refill:  0    Orders Placed This Encounter  Procedures   XR Foot Complete Right     Procedures: No procedures performed  Clinical History: No specialty comments available.  He reports that he quit smoking about 35 years ago. His smoking use included cigarettes. He started smoking about 45 years ago. He has a 5 pack-year smoking history. He has never used smokeless tobacco. No results for input(s): "HGBA1C", "LABURIC" in the last 8760 hours.  Objective:    Physical Exam  Gen: Well-appearing, in no acute distress; non-toxic CV: Well-perfused. Warm.  Resp: Breathing unlabored on room air; no wheezing. Psych: Fluid speech in conversation; appropriate affect; normal thought process Neuro: Sensation intact throughout. No gross coordination deficits.   Ortho Exam - Left hip: There is limited internal and external rotation, positive FADIR test.  - Right foot/ankle: Positive TTP over the  medial aspect of the plantar calcaneus as well as the distal Achilles.  No redness swelling or effusion here.  There is equivocal plantarflexion and dorsiflexion.  Mild pes planovalgus with standing.  Imaging: XR Foot Complete Right  Result Date: 12/29/2022 3 views the right foot including AP, lateral, oblique normal ordered and reviewed by myself.  X-rays show mild degenerative changes of the midfoot.  There is a small plantar calcaneal spur as well as rather significant enthesiopathy change of the superior calcaneus near the Achilles insertion.  No acute bony fracture noted.   Past Medical/Family/Surgical/Social History: Medications & Allergies reviewed per EMR, new medications updated. Patient Active Problem List   Diagnosis Date Noted   Trochanteric bursitis of left hip 06/03/2017   Osteoarthritis of right hip 01/09/2017   Essential hypertension 03/06/2012   Perirectal abscess 03/06/2012   Hyperlipidemia 11/19/2009   Past Medical History:  Diagnosis Date   Anxiety    Arthritis    "right hip" (01/09/2017)   Asthma    DM (diabetes mellitus) (HCC)    High blood pressure    High cholesterol    Family History  Problem Relation Age of Onset   Heart disease Mother    Leukemia Brother    Heart attack Brother    Colon cancer Neg Hx    Colon polyps Neg Hx    Esophageal cancer Neg Hx    Rectal cancer Neg Hx    Stomach cancer Neg Hx    Past Surgical History:  Procedure Laterality Date   COLONOSCOPY  06/21/2022   COLONOSCOPY  09/03/2009   F/V, Melvia Heaps, excellent prep, normal   DENTAL SURGERY     "had an implant" (01/09/2017)   JOINT REPLACEMENT     TOTAL HIP ARTHROPLASTY Right 01/09/2017   TOTAL HIP ARTHROPLASTY Right 01/09/2017   Procedure: RIGHT TOTAL HIP ARTHROPLASTY ANTERIOR APPROACH;  Surgeon: Samson Frederic, MD;  Location: [facility] OR;  Service: Orthopedics;  Laterality: Right;   Social History   Occupational History   Not on file  Tobacco Use   Smoking status:  Former    Current packs/day: 0.00    Average packs/day: 0.5 packs/day for 10.0 years (5.0 ttl pk-yrs)    Types: Cigarettes    Start date: 75    Quit date: 1989    Years since quitting: 35.6   Smokeless tobacco: Never   Tobacco comments:    "quit smoking in the 1980s"  Vaping Use   Vaping status: Never Used  Substance and Sexual Activity   Alcohol use: Not Currently    Comment: 01/09/2017 "once/year"   Drug use: Not Currently    Types: Cocaine    Comment: "went thru treatment back in the 1980s"   Sexual activity: Yes

## 2023-02-01 ENCOUNTER — Encounter: Payer: Self-pay | Admitting: Sports Medicine

## 2023-02-01 ENCOUNTER — Other Ambulatory Visit: Payer: Self-pay

## 2023-02-01 ENCOUNTER — Ambulatory Visit: Payer: Medicare HMO | Admitting: Sports Medicine

## 2023-02-01 DIAGNOSIS — M2170 Unequal limb length (acquired), unspecified site: Secondary | ICD-10-CM

## 2023-02-01 DIAGNOSIS — M1612 Unilateral primary osteoarthritis, left hip: Secondary | ICD-10-CM | POA: Diagnosis not present

## 2023-02-01 DIAGNOSIS — M7661 Achilles tendinitis, right leg: Secondary | ICD-10-CM | POA: Diagnosis not present

## 2023-02-01 MED ORDER — METHYLPREDNISOLONE ACETATE 40 MG/ML IJ SUSP
40.0000 mg | INTRAMUSCULAR | Status: AC | PRN
Start: 2023-02-01 — End: 2023-02-01
  Administered 2023-02-01: 40 mg via INTRA_ARTICULAR

## 2023-02-01 MED ORDER — LIDOCAINE HCL 1 % IJ SOLN
4.0000 mL | INTRAMUSCULAR | Status: AC | PRN
Start: 2023-02-01 — End: 2023-02-01
  Administered 2023-02-01: 4 mL

## 2023-02-01 NOTE — Progress Notes (Signed)
Alex Hunter - 67 y.o. male MRN [id number]  Date of birth: [birth year]  Office Visit Note: Visit Date: 02/01/2023 PCP: Melida Quitter, MD Referred by: Melida Quitter, MD  Subjective: Chief Complaint  Patient presents with   Left Hip - Pain   Right Foot - Pain   HPI: Alex Hunter is a pleasant 67 y.o. male who presents today for follow-up of acute on chronic left hip pain with known arthritis, right leg pain with Achilles tendinitis.  We did perform intra-articular left hip injection on 10/05/2022, this gave him relief for about 2+ months but his pain started become worse again.  He feels walking differently because of this.  He does have Celebrex 100 mg to take twice daily as needed.  He has had some pain in the heel and the Achilles -we did have pain and 5/16 inch heel lifts and he has been doing home exercise protocol that we showed him.  He notes with these exercises it is feeling much better but still gives him some issues.  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  Pertinent ROS were reviewed with the patient and found to be negative unless otherwise specified above in HPI.   Assessment & Plan: Visit Diagnoses:  1. Unilateral primary osteoarthritis, left hip   2. Leg length discrepancy   3. Achilles tendinitis, right leg    Plan: Vandan is dealing with an exacerbation of his chronic left hip osteoarthritis, which is advanced in nature.  Shared decision-making, did elect to proceed with ultrasound-guided intra-articular hip injection, patient tolerated well.  He is okay to continue these infrequently, he is not ready for hip replacement but knows this may be an option for him for future treatment.  For this and his Achilles tendinopathy, he may continue Celebrex 100 mg once to twice daily as needed.  I did replace his heel lifts and upgrade him to a 7/16 inch to help offload the Achilles.  He will continue his home rehab exercises  for the Achilles tendon and ankle stability.  I would like to follow-up in about 1 month to recheck the Achilles.  Additional treatment considerations could be a few trials of extracorporeal shockwave therapy, consider nitroglycerin patch protocol. F/u in 1 month.  Follow-up: Return in about 1 month (around 03/04/2023) for for R-achilles/foot.   Meds & Orders: No orders of the defined types were placed in this encounter.   Orders Placed This Encounter  Procedures   Large Joint Inj   US Guided Needle Placement - No Linked Charges     Procedures: Large Joint Inj: L hip joint on 02/01/2023 8:58 AM Indications: pain Details: 22 G 3.5 in needle, ultrasound-guided anterior approach Medications: 4 mL lidocaine 1 %; 40 mg methylPREDNISolone acetate 40 MG/ML Outcome: tolerated well, no immediate complications  Procedure: US-guided intra-articular hip injection, left After discussion on risks/benefits/indications and informed verbal consent was obtained, a timeout was performed. Patient was lying supine on exam table. The hip was cleaned with betadine and alcohol swabs. Then utilizing ultrasound guidance, the patient's femoral head and neck junction was identified and subsequently injected with 4:1 lidocaine:depomedrol via an in-plane approach with ultrasound visualization of the injectate administered into the hip joint. Patient tolerated procedure well without immediate complications.  Procedure, treatment alternatives, risks and benefits explained, specific risks discussed. Consent was given by the patient. Immediately prior to procedure a time out was called  to verify the correct patient, procedure, equipment, support staff and site/side marked as required. Patient was prepped and draped in the usual sterile fashion.          Clinical History: No specialty comments available.  He reports that he quit smoking about 35 years ago. His smoking use included cigarettes. He started smoking about 45  years ago. He has a 5 pack-year smoking history. He has never used smokeless tobacco. No results for input(s): "HGBA1C", "LABURIC" in the last 8760 hours.  Objective:    Physical Exam  Gen: Well-appearing, in no acute distress; non-toxic CV:  Well-perfused. Warm.  Resp: Breathing unlabored on room air; no wheezing. Psych: Fluid speech in conversation; appropriate affect; normal thought process Neuro: Sensation intact throughout. No gross coordination deficits.   Ortho Exam - Left hip: No specific bony TTP.  There is limited internal rotation in about 8 degrees less of external rotation.  Positive FADIR, positive Stinchfield test.  - Right ankle/foot: No TTP, No Redness Swelling of the Ankle Joint.  Full Range Plantarflexion and Dorsiflexion.  There Is Some Tenderness to Palpation in the Mid Substance of the Achilles Tendon, Able to Perform Resisted Plantarflexion without Significant Pain.  Imaging: No results found.  Past Medical/Family/Surgical/Social History: Medications & Allergies reviewed per EMR, new medications updated. Patient Active Problem List   Diagnosis Date Noted   Trochanteric bursitis of left hip 06/03/2017   Osteoarthritis of right hip 01/09/2017   Essential hypertension 03/06/2012   Perirectal abscess 03/06/2012   Hyperlipidemia 11/19/2009   Past Medical History:  Diagnosis Date   Anxiety    Arthritis    "right hip" (01/09/2017)   Asthma    DM (diabetes mellitus) (HCC)    High blood pressure    High cholesterol    Family History  Problem Relation Age of Onset   Heart disease Mother    Leukemia Brother    Heart attack Brother    Colon cancer Neg Hx    Colon polyps Neg Hx    Esophageal cancer Neg Hx    Rectal cancer Neg Hx    Stomach cancer Neg Hx    Past Surgical History:  Procedure Laterality Date   COLONOSCOPY  06/21/2022   COLONOSCOPY  09/03/2009   F/V, Melvia Heaps, excellent prep, normal   DENTAL SURGERY     "had an implant" (01/09/2017)    JOINT REPLACEMENT     TOTAL HIP ARTHROPLASTY Right 01/09/2017   TOTAL HIP ARTHROPLASTY Right 01/09/2017   Procedure: RIGHT TOTAL HIP ARTHROPLASTY ANTERIOR APPROACH;  Surgeon: Samson Frederic, MD;  Location: [facility] OR;  Service: Orthopedics;  Laterality: Right;   Social History   Occupational History   Not on file  Tobacco Use   Smoking status: Former    Current packs/day: 0.00    Average packs/day: 0.5 packs/day for 10.0 years (5.0 ttl pk-yrs)    Types: Cigarettes    Start date: 74    Quit date: 1989    Years since quitting: 35.7   Smokeless tobacco: Never   Tobacco comments:    "quit smoking in the 1980s"  Vaping Use   Vaping status: Never Used  Substance and Sexual Activity   Alcohol use: Not Currently    Comment: 01/09/2017 "once/year"   Drug use: Not Currently    Types: Cocaine    Comment: "went thru treatment back in the 1980s"   Sexual activity: Yes

## 2023-02-01 NOTE — Progress Notes (Signed)
Patient is inquiring about an injection for his left hip. He is hoping that his foot can get treatment as well, but says that his hip is more bothersome today. He says that the heel lifts in his shoe helped a lot but that he feels they have worn down and is hoping he can get more of those today as well.

## 2023-02-15 DIAGNOSIS — Z1389 Encounter for screening for other disorder: Secondary | ICD-10-CM | POA: Diagnosis not present

## 2023-02-15 DIAGNOSIS — I10 Essential (primary) hypertension: Secondary | ICD-10-CM | POA: Diagnosis not present

## 2023-02-15 DIAGNOSIS — E1159 Type 2 diabetes mellitus with other circulatory complications: Secondary | ICD-10-CM | POA: Diagnosis not present

## 2023-02-15 DIAGNOSIS — Z125 Encounter for screening for malignant neoplasm of prostate: Secondary | ICD-10-CM | POA: Diagnosis not present

## 2023-02-20 DIAGNOSIS — Z136 Encounter for screening for cardiovascular disorders: Secondary | ICD-10-CM | POA: Diagnosis not present

## 2023-02-20 DIAGNOSIS — E669 Obesity, unspecified: Secondary | ICD-10-CM | POA: Diagnosis not present

## 2023-02-20 DIAGNOSIS — E785 Hyperlipidemia, unspecified: Secondary | ICD-10-CM | POA: Diagnosis not present

## 2023-02-20 DIAGNOSIS — I10 Essential (primary) hypertension: Secondary | ICD-10-CM | POA: Diagnosis not present

## 2023-02-22 ENCOUNTER — Telehealth: Payer: Self-pay | Admitting: Orthopaedic Surgery

## 2023-02-22 DIAGNOSIS — I10 Essential (primary) hypertension: Secondary | ICD-10-CM | POA: Diagnosis not present

## 2023-02-22 DIAGNOSIS — Z23 Encounter for immunization: Secondary | ICD-10-CM | POA: Diagnosis not present

## 2023-02-22 DIAGNOSIS — E669 Obesity, unspecified: Secondary | ICD-10-CM | POA: Diagnosis not present

## 2023-02-22 DIAGNOSIS — Z6833 Body mass index (BMI) 33.0-33.9, adult: Secondary | ICD-10-CM | POA: Diagnosis not present

## 2023-02-22 DIAGNOSIS — I700 Atherosclerosis of aorta: Secondary | ICD-10-CM | POA: Diagnosis not present

## 2023-02-22 DIAGNOSIS — Z0000 Encounter for general adult medical examination without abnormal findings: Secondary | ICD-10-CM | POA: Diagnosis not present

## 2023-02-22 DIAGNOSIS — M169 Osteoarthritis of hip, unspecified: Secondary | ICD-10-CM | POA: Diagnosis not present

## 2023-02-22 DIAGNOSIS — N200 Calculus of kidney: Secondary | ICD-10-CM | POA: Diagnosis not present

## 2023-02-22 DIAGNOSIS — E785 Hyperlipidemia, unspecified: Secondary | ICD-10-CM | POA: Diagnosis not present

## 2023-02-22 DIAGNOSIS — E1159 Type 2 diabetes mellitus with other circulatory complications: Secondary | ICD-10-CM | POA: Diagnosis not present

## 2023-02-22 NOTE — Telephone Encounter (Signed)
Patient called asked /if Dr. Magnus Ivan would give him a call. Patient want to know what he can take to help with the pain he is having in his hip? The number to contact patient is [phone]

## 2023-02-24 NOTE — Telephone Encounter (Signed)
I called patient.  Moved up appointment with Dr. Magnus Ivan.

## 2023-02-27 ENCOUNTER — Other Ambulatory Visit (INDEPENDENT_AMBULATORY_CARE_PROVIDER_SITE_OTHER): Payer: Self-pay

## 2023-02-27 ENCOUNTER — Ambulatory Visit: Payer: Medicare HMO | Admitting: Orthopaedic Surgery

## 2023-02-27 ENCOUNTER — Encounter: Payer: Self-pay | Admitting: Orthopaedic Surgery

## 2023-02-27 DIAGNOSIS — M25552 Pain in left hip: Secondary | ICD-10-CM

## 2023-02-27 DIAGNOSIS — M1612 Unilateral primary osteoarthritis, left hip: Secondary | ICD-10-CM | POA: Diagnosis not present

## 2023-02-27 NOTE — Progress Notes (Signed)
The patient is a 67 year old gentleman sent to me by my partner Dr. Shon Baton to evaluate and treat known arthritis of his left hip.  He has had a series of steroid injections over time in the left hip.  He does this about his groin pain and pain with activities of daily living.  He does get pain in the groin when he pivots and rotates on the left hip.  He does have a history of a right total hip arthroplasty done in 2018 by one of my colleagues in town.  There is a leg length discrepancy that is noted as well with his right side longer than the left side.  His most recent steroid injection in that left hip was under ultrasound with Dr. Shon Baton earlier this month.  He is a diabetic.  He said his blood glucose has been running a little high.  He just had his hemoglobin A1c checked in the last few days.  He has problems with his ADLs as a relates to his left hip arthritis and pain.  This is affecting his posture detrimentally as well.  An AP pelvis shows severe bone-on-bone arthritis of his left hip.  There are sclerotic and cystic changes and complete loss of joint space.  This is worsened over a year from previous x-rays.  On exam his left side is shorter than his right.  He does have a history of a right total hip arthroplasty.  He has significant limitations in internal and external rotation of that left hip.  The right hip moves more smoothly.  At this point given the severity of his left hip pain combined with the arthritic findings and the failure conservative treatment, he does wish to proceed with a total hip arthroplasty and I agree with this as well based on everything that was just mention.  Having had this before he is fully aware of the risks and benefits of the surgery and what to expect from an intraoperative and postoperative standpoint.  Obviously if his hemoglobin A1c is above a certain amount we will have to address this accordingly..  All questions and concerns were answered and addressed.

## 2023-03-15 ENCOUNTER — Ambulatory Visit: Payer: Medicare HMO | Admitting: Orthopaedic Surgery

## 2023-03-21 DIAGNOSIS — E669 Obesity, unspecified: Secondary | ICD-10-CM | POA: Diagnosis not present

## 2023-03-21 DIAGNOSIS — I700 Atherosclerosis of aorta: Secondary | ICD-10-CM | POA: Diagnosis not present

## 2023-03-21 DIAGNOSIS — E785 Hyperlipidemia, unspecified: Secondary | ICD-10-CM | POA: Diagnosis not present

## 2023-03-21 DIAGNOSIS — M169 Osteoarthritis of hip, unspecified: Secondary | ICD-10-CM | POA: Diagnosis not present

## 2023-03-21 DIAGNOSIS — I10 Essential (primary) hypertension: Secondary | ICD-10-CM | POA: Diagnosis not present

## 2023-03-21 DIAGNOSIS — E1159 Type 2 diabetes mellitus with other circulatory complications: Secondary | ICD-10-CM | POA: Diagnosis not present

## 2023-03-21 DIAGNOSIS — Z6833 Body mass index (BMI) 33.0-33.9, adult: Secondary | ICD-10-CM | POA: Diagnosis not present

## 2023-04-13 NOTE — Patient Instructions (Addendum)
SURGICAL WAITING ROOM VISITATION  Patients having surgery or a procedure may have no more than 2 support people in the waiting area - these visitors may rotate.    Children under the age of 11 must have an adult with them who is not the patient.  Due to an increase in RSV and influenza rates and associated hospitalizations, children ages 25 and under may not visit patients in [facility] hospitals.  If the patient needs to stay at the hospital during part of their recovery, the visitor guidelines for inpatient rooms apply. Pre-op nurse will coordinate an appropriate time for 1 support person to accompany patient in pre-op.  This support person may not rotate.    Please refer to the [facility] website for the visitor guidelines for Inpatients (after your surgery is over and you are in a regular room).       Your procedure is scheduled on: 04/21/23   Report to [facility] Main Entrance    Report to admitting at 6 AM   Call this number if you have problems the morning of surgery [phone]   Do not eat food :After Midnight.   After Midnight you may have the following liquids until 5:30 AM DAY OF SURGERY  Water Non-Citrus Juices (without pulp, NO RED-Apple, White grape, White cranberry) Black Coffee (NO MILK/CREAM OR CREAMERS, sugar ok)  Clear Tea (NO MILK/CREAM OR CREAMERS, sugar ok) regular and decaf                             Plain Jell-O (NO RED)                                           Fruit ices (not with fruit pulp, NO RED)                                     Popsicles (NO RED)                                                               Sports drinks like Gatorade (NO RED)                The day of surgery:  Drink ONE (1) Pre-Surgery  G2 at 5:30 AM the morning of surgery. Drink in one sitting. Do not sip.  This drink was given to you during your hospital  pre-op appointment visit. Nothing else to drink after completing the  Pre-Surgery G2.              Oral Hygiene is also important to reduce your risk of infection.                                    Remember - BRUSH YOUR TEETH THE MORNING OF SURGERY WITH YOUR REGULAR TOOTHPASTE   Stop all vitamins and herbal supplements 7 days before surgery.   Take these medicines the morning of surgery with A SIP OF WATER: Ezetimibe  DO  NOT TAKE ANY ORAL DIABETIC MEDICATIONS DAY OF YOUR SURGERY DO NOT TAKE METFORMIN THE DAY OF SURGERY.             You may not have any metal on your body including hair pins, jewelry, and body piercing             Do not wear make-up, lotions, powders, perfumes/cologne, or deodorant              Men may shave face and neck.   Do not bring valuables to the hospital. [facility] IS NOT             RESPONSIBLE   FOR VALUABLES.   Contacts, glasses, dentures or bridgework may not be worn into surgery.   Bring small overnight bag day of surgery.   DO NOT BRING YOUR HOME MEDICATIONS TO THE HOSPITAL. PHARMACY WILL DISPENSE MEDICATIONS LISTED ON YOUR MEDICATION LIST TO YOU DURING YOUR ADMISSION IN THE HOSPITAL!    Patients discharged on the day of surgery will not be allowed to drive home.  Someone NEEDS to stay with you for the first 24 hours after anesthesia.   Special Instructions: Bring a copy of your healthcare power of attorney and living will documents the day of surgery if you haven't scanned them before.              Please read over the following fact sheets you were given: IF YOU HAVE QUESTIONS ABOUT YOUR PRE-OP INSTRUCTIONS PLEASE CALL [phone] [address]   If you received a COVID test during your pre-op visit  it is requested that you wear a mask when out in public, stay away from anyone that may not be feeling well and notify your surgeon if you develop symptoms. If you test positive for Covid or have been in contact with anyone that has tested positive in the last 10 days please notify you surgeon.      Pre-operative 5 CHG Bath Instructions   You can  play a key role in reducing the risk of infection after surgery. Your skin needs to be as free of germs as possible. You can reduce the number of germs on your skin by washing with CHG (chlorhexidine gluconate) soap before surgery. CHG is an antiseptic soap that kills germs and continues to kill germs even after washing.   DO NOT use if you have an allergy to chlorhexidine/CHG or antibacterial soaps. If your skin becomes reddened or irritated, stop using the CHG and notify one of our RNs at [phone].   Please shower with the CHG soap starting 4 days before surgery using the following schedule:     Please keep in mind the following:  DO NOT shave, including legs and underarms, starting the day of your first shower.   You may shave your face at any point before/day of surgery.  Place clean sheets on your bed the day you start using CHG soap. Use a clean washcloth (not used since being washed) for each shower. DO NOT sleep with pets once you start using the CHG.   CHG Shower Instructions:  If you choose to wash your hair and private area, wash first with your normal shampoo/soap.  After you use shampoo/soap, rinse your hair and body thoroughly to remove shampoo/soap residue.  Turn the water OFF and apply about 3 tablespoons (45 ml) of CHG soap to a CLEAN washcloth.  Apply CHG soap ONLY FROM YOUR NECK DOWN TO YOUR TOES (washing for 3-5 minutes)  DO NOT use CHG soap on face, private areas, open wounds, or sores.  Pay special attention to the area where your surgery is being performed.  If you are having back surgery, having someone wash your back for you may be helpful. Wait 2 minutes after CHG soap is applied, then you may rinse off the CHG soap.  Pat dry with a clean towel  Put on clean clothes/pajamas   If you choose to wear lotion, please use ONLY the CHG-compatible lotions on the back of this paper.     Additional instructions for the day of surgery: DO NOT APPLY any lotions,  deodorants, cologne, or perfumes.   Put on clean/comfortable clothes.  Brush your teeth.  Ask your nurse before applying any prescription medications to the skin.      CHG Compatible Lotions   Aveeno Moisturizing lotion  Cetaphil Moisturizing Cream  Cetaphil Moisturizing Lotion  Clairol Herbal Essence Moisturizing Lotion, Dry Skin  Clairol Herbal Essence Moisturizing Lotion, Extra Dry Skin  Clairol Herbal Essence Moisturizing Lotion, Normal Skin  Curel Age Defying Therapeutic Moisturizing Lotion with Alpha Hydroxy  Curel Extreme Care Body Lotion  Curel Soothing Hands Moisturizing Hand Lotion  Curel Therapeutic Moisturizing Cream, Fragrance-Free  Curel Therapeutic Moisturizing Lotion, Fragrance-Free  Curel Therapeutic Moisturizing Lotion, Original Formula  Eucerin Daily Replenishing Lotion  Eucerin Dry Skin Therapy Plus Alpha Hydroxy Crme  Eucerin Dry Skin Therapy Plus Alpha Hydroxy Lotion  Eucerin Original Crme  Eucerin Original Lotion  Eucerin Plus Crme Eucerin Plus Lotion  Eucerin TriLipid Replenishing Lotion  Keri Anti-Bacterial Hand Lotion  Keri Deep Conditioning Original Lotion Dry Skin Formula Softly Scented  Keri Deep Conditioning Original Lotion, Fragrance Free Sensitive Skin Formula  Keri Lotion Fast Absorbing Fragrance Free Sensitive Skin Formula  Keri Lotion Fast Absorbing Softly Scented Dry Skin Formula  Keri Original Lotion  Keri Skin Renewal Lotion Keri Silky Smooth Lotion  Keri Silky Smooth Sensitive Skin Lotion  Nivea Body Creamy Conditioning Oil  Nivea Body Extra Enriched Lotion  Nivea Body Original Lotion  Nivea Body Sheer Moisturizing Lotion Nivea Crme  Nivea Skin Firming Lotion  NutraDerm 30 Skin Lotion  NutraDerm Skin Lotion  NutraDerm Therapeutic Skin Cream  NutraDerm Therapeutic Skin Lotion  ProShield Protective Hand Cream   Incentive Spirometer (Watch this video at home: [email or web address])  An incentive  spirometer is a tool that can help keep your lungs clear and active. This tool measures how well you are filling your lungs with each breath. Taking long deep breaths may help reverse or decrease the chance of developing breathing (pulmonary) problems (especially infection) following: A long period of time when you are unable to move or be active. BEFORE THE PROCEDURE  If the spirometer includes an indicator to show your best effort, your nurse or respiratory therapist will set it to a desired goal. If possible, sit up straight or lean slightly forward. Try not to slouch. Hold the incentive spirometer in an upright position. INSTRUCTIONS FOR USE  Sit on the edge of your bed if possible, or sit up as far as you can in bed or on a chair. Hold the incentive spirometer in an upright position. Breathe out normally. Place the mouthpiece in your mouth and seal your lips tightly around it. Breathe in slowly and as deeply as possible, raising the piston or the ball toward the top of the column. Hold your breath for 3-5 seconds or for as long as possible. Allow the piston or ball to fall to the bottom  of the column. Remove the mouthpiece from your mouth and breathe out normally. Rest for a few seconds and repeat Steps 1 through 7 at least 10 times every 1-2 hours when you are awake. Take your time and take a few normal breaths between deep breaths. The spirometer may include an indicator to show your best effort. Use the indicator as a goal to work toward during each repetition. After each set of 10 deep breaths, practice coughing to be sure your lungs are clear. If you have an incision (the cut made at the time of surgery), support your incision when coughing by placing a pillow or rolled up towels firmly against it. Once you are able to get out of bed, walk around indoors and cough well. You may stop using the incentive spirometer when instructed by your caregiver.  RISKS AND COMPLICATIONS Take your time  so you do not get dizzy or light-headed. If you are in pain, you may need to take or ask for pain medication before doing incentive spirometry. It is harder to take a deep breath if you are having pain. AFTER USE Rest and breathe slowly and easily. It can be helpful to keep track of a log of your progress. Your caregiver can provide you with a simple table to help with this. If you are using the spirometer at home, follow these instructions: SEEK MEDICAL CARE IF:  You are having difficultly using the spirometer. You have trouble using the spirometer as often as instructed. Your pain medication is not giving enough relief while using the spirometer. You develop fever of 100.5 F (38.1 C) or higher. SEEK IMMEDIATE MEDICAL CARE IF:  You cough up bloody sputum that had not been present before. You develop fever of 102 F (38.9 C) or greater. You develop worsening pain at or near the incision site. MAKE SURE YOU:  Understand these instructions. Will watch your condition. Will get help right away if you are not doing well or get worse. Document Released: 08/29/2006 Document Revised: 07/11/2011 Document Reviewed: 10/30/2006   WHAT IS A BLOOD TRANSFUSION? Blood Transfusion Information  A transfusion is the replacement of blood or some of its parts. Blood is made up of multiple cells which provide different functions. Red blood cells carry oxygen and are used for blood loss replacement. White blood cells fight against infection. Platelets control bleeding. Plasma helps clot blood. Other blood products are available for specialized needs, such as hemophilia or other clotting disorders. BEFORE THE TRANSFUSION  Who gives blood for transfusions?  Healthy volunteers who are fully evaluated to make sure their blood is safe. This is blood bank blood. Transfusion therapy is the safest it has ever been in the practice of medicine. Before blood is taken from a donor, a complete history is taken to  make sure that person has no history of diseases nor engages in risky social behavior (examples are intravenous drug use or sexual activity with multiple partners). The donor's travel history is screened to minimize risk of transmitting infections, such as malaria. The donated blood is tested for signs of infectious diseases, such as HIV and hepatitis. The blood is then tested to be sure it is compatible with you in order to minimize the chance of a transfusion reaction. If you or a relative donates blood, this is often done in anticipation of surgery and is not appropriate for emergency situations. It takes many days to process the donated blood. RISKS AND COMPLICATIONS Although transfusion therapy is very safe and  saves many lives, the main dangers of transfusion include:  Getting an infectious disease. Developing a transfusion reaction. This is an allergic reaction to something in the blood you were given. Every precaution is taken to prevent this. The decision to have a blood transfusion has been considered carefully by your caregiver before blood is given. Blood is not given unless the benefits outweigh the risks. AFTER THE TRANSFUSION Right after receiving a blood transfusion, you will usually feel much better and more energetic. This is especially true if your red blood cells have gotten low (anemic). The transfusion raises the level of the red blood cells which carry oxygen, and this usually causes an energy increase. The nurse administering the transfusion will monitor you carefully for complications. HOME CARE INSTRUCTIONS  No special instructions are needed after a transfusion. You may find your energy is better. Speak with your caregiver about any limitations on activity for underlying diseases you may have. SEEK MEDICAL CARE IF:  Your condition is not improving after your transfusion. You develop redness or irritation at the intravenous (IV) site. SEEK IMMEDIATE MEDICAL CARE IF:  Any of  the following symptoms occur over the next 12 hours: Shaking chills. You have a temperature by mouth above 102 F (38.9 C), not controlled by medicine. Chest, back, or muscle pain. People around you feel you are not acting correctly or are confused. Shortness of breath or difficulty breathing. Dizziness and fainting. You get a rash or develop hives. You have a decrease in urine output. Your urine turns a dark color or changes to pink, red, or brown. Any of the following symptoms occur over the next 10 days: You have a temperature by mouth above 102 F (38.9 C), not controlled by medicine. Shortness of breath. Weakness after normal activity. The white part of the eye turns yellow (jaundice). You have a decrease in the amount of urine or are urinating less often. Your urine turns a dark color or changes to pink, red, or brown. Document Released: 04/15/2000 Document Revised: 07/11/2011 Document Reviewed: 12/03/2007 [facility] Patient Information 2014 [place], [place].

## 2023-04-13 NOTE — Progress Notes (Addendum)
COVID Vaccine received:  []  No [x]  Yes Date of any COVID positive Test in last 90 days: no PCP - Dorinda Hill MD Cardiologist - no  Chest x-ray -  EKG -  04/14/23 Stress Test -  ECHO -  Cardiac Cath -   Bowel Prep - [x]  No  []   Yes ______  Pacemaker / ICD device [x]  No []  Yes   Spinal Cord Stimulator:[x]  No []  Yes       History of Sleep Apnea? [x]  No []  Yes   CPAP used?- [x]  No []  Yes    Does the patient monitor blood sugar?          [x]  No []  Yes  []  N/A  Patient has: []  NO Hx DM   []  Pre-DM                 []  DM1  [x]   DM2 Does patient have a [employer] or Dexacom? []  No []  Yes   Fasting Blood Sugar Ranges-  Checks Blood Sugar _____ times a day  GLP1 agonist / usual dose - no GLP1 instructions:  SGLT-2 inhibitors / usual dose - no SGLT-2 instructions:   Blood Thinner / Instructions:no Aspirin Instructions:ASA 81mg  Will stop Sunday 04/16/23  Comments:   Activity level: Patient is able  to climb a flight of stairs without difficulty; [x]  No CP  [x]  No SOB,  _   Patient can  perform ADLs without assistance.   Anesthesia review:   Patient denies shortness of breath, fever, cough and chest pain at PAT appointment.  Patient verbalized understanding and agreement to the Pre-Surgical Instructions that were given to them at this PAT appointment. Patient was also educated of the need to review these PAT instructions again prior to his/her surgery.I reviewed the appropriate phone numbers to call if they have any and questions or concerns.

## 2023-04-14 ENCOUNTER — Other Ambulatory Visit: Payer: Self-pay

## 2023-04-14 ENCOUNTER — Other Ambulatory Visit: Payer: Self-pay | Admitting: Sports Medicine

## 2023-04-14 ENCOUNTER — Encounter (HOSPITAL_COMMUNITY)
Admission: RE | Admit: 2023-04-14 | Discharge: 2023-04-14 | Disposition: A | Discharge status: Home / Self Care | Payer: Medicare HMO | Source: Ambulatory Visit | Attending: Orthopaedic Surgery | Admitting: Orthopaedic Surgery

## 2023-04-14 ENCOUNTER — Encounter (HOSPITAL_COMMUNITY): Payer: Self-pay

## 2023-04-14 VITALS — BP 138/92 | HR 76 | Temp 97.9°F | Resp 16 | Ht 65.5 in | Wt 210.0 lb

## 2023-04-14 DIAGNOSIS — Z01810 Encounter for preprocedural cardiovascular examination: Secondary | ICD-10-CM | POA: Diagnosis present

## 2023-04-14 DIAGNOSIS — Z01812 Encounter for preprocedural laboratory examination: Secondary | ICD-10-CM | POA: Diagnosis present

## 2023-04-14 DIAGNOSIS — I10 Essential (primary) hypertension: Secondary | ICD-10-CM | POA: Diagnosis not present

## 2023-04-14 DIAGNOSIS — Z01818 Encounter for other preprocedural examination: Secondary | ICD-10-CM | POA: Diagnosis not present

## 2023-04-14 DIAGNOSIS — E119 Type 2 diabetes mellitus without complications: Secondary | ICD-10-CM | POA: Diagnosis not present

## 2023-04-14 DIAGNOSIS — M1612 Unilateral primary osteoarthritis, left hip: Secondary | ICD-10-CM | POA: Diagnosis not present

## 2023-04-14 HISTORY — DX: Personal history of urinary calculi: Z87.442

## 2023-04-14 LAB — BASIC METABOLIC PANEL
Anion gap: 6 (ref 5–15)
BUN: 15 mg/dL (ref 8–23)
CO2: 26 mmol/L (ref 22–32)
Calcium: 9.1 mg/dL (ref 8.9–10.3)
Chloride: 105 mmol/L (ref 98–111)
Creatinine, Ser: 1.08 mg/dL (ref 0.61–1.24)
GFR, Estimated: >60 mL/min (ref >60)
Glucose, Bld: 101 mg/dL — ABNORMAL HIGH (ref 70–99)
Potassium: 4 mmol/L (ref 3.5–5.1)
Sodium: 137 mmol/L (ref 135–145)

## 2023-04-14 LAB — GLUCOSE, CAPILLARY: Glucose-Capillary: 107 mg/dL — ABNORMAL HIGH (ref 70–99)

## 2023-04-14 LAB — CBC
HCT: 47.1 % (ref 39.0–52.0)
Hemoglobin: 15.1 g/dL (ref 13.0–17.0)
MCH: 27.3 pg (ref 26.0–34.0)
MCHC: 32.1 g/dL (ref 30.0–36.0)
MCV: 85 fL (ref 80.0–100.0)
Platelets: 275 10*3/uL (ref 150–400)
RBC: 5.54 MIL/uL (ref 4.22–5.81)
RDW: 15.8 % — ABNORMAL HIGH (ref 11.5–15.5)
WBC: 5.5 10*3/uL (ref 4.0–10.5)
nRBC: 0 % (ref 0.0–0.2)

## 2023-04-14 LAB — SURGICAL PCR SCREEN
MRSA, PCR: NEGATIVE
Staphylococcus aureus: NEGATIVE

## 2023-04-14 LAB — HEMOGLOBIN A1C
Hgb A1c MFr Bld: 6.3 % — ABNORMAL HIGH (ref 4.8–5.6)
Mean Plasma Glucose: 134.11 mg/dL

## 2023-04-18 ENCOUNTER — Telehealth: Payer: Self-pay | Admitting: *Deleted

## 2023-04-18 NOTE — Telephone Encounter (Signed)
Ortho bundle pre-op call completed. 

## 2023-04-18 NOTE — Care Plan (Signed)
[facility] RNCM call to patient to discuss his upcoming Left total hip arthroplasty with Dr. Magnus Ivan at [facility] on 04/21/23. Patient is agreeable to case management. He expects to discharge with his fianc to her home after discharge from hospital. He will need a RW. This has been ordered through [employer] for delivery to hospital on day of surgery. Anticipate HHPT will be needed after a short hospital stay. Referral made to [facility] after choice provided. Reviewed post op care instructions and questions answered. Will continue to follow for needs.

## 2023-04-20 DIAGNOSIS — M1612 Unilateral primary osteoarthritis, left hip: Secondary | ICD-10-CM

## 2023-04-20 NOTE — H&P (Signed)
TOTAL HIP ADMISSION H&P  Patient is admitted for left total hip arthroplasty.  Subjective:  Chief Complaint: left hip pain  HPI: Alex Hunter, 67 y.o. male, has a history of pain and functional disability in the left hip(s) due to arthritis and patient has failed non-surgical conservative treatments for greater than 12 weeks to include NSAID's and/or analgesics, corticosteriod injections, flexibility and strengthening excercises, weight reduction as appropriate, and activity modification.  Onset of symptoms was gradual starting 3 years ago with gradually worsening course since that time.The patient noted no past surgery on the left hip(s).  Patient currently rates pain in the left hip at 10 out of 10 with activity. Patient has night pain, worsening of pain with activity and weight bearing, trendelenberg gait, pain that interfers with activities of daily living, and pain with passive range of motion. Patient has evidence of subchondral cysts, subchondral sclerosis, and joint space narrowing by imaging studies. This condition presents safety issues increasing the risk of falls.  There is no current active infection.  Patient Active Problem List   Diagnosis Date Noted   Unilateral primary osteoarthritis, left hip 04/20/2023   Trochanteric bursitis of left hip 06/03/2017   Essential hypertension 03/06/2012   Perirectal abscess 03/06/2012   Hyperlipidemia 11/19/2009   Past Medical History:  Diagnosis Date   Anxiety    Arthritis    "right hip" (01/09/2017)   Asthma    DM (diabetes mellitus) (HCC)    High blood pressure    High cholesterol    History of kidney stones     Past Surgical History:  Procedure Laterality Date   COLONOSCOPY  06/21/2022   COLONOSCOPY  09/03/2009   F/V, Melvia Heaps, excellent prep, normal   DENTAL SURGERY     "had an implant" (01/09/2017)   TOTAL HIP ARTHROPLASTY Right 01/09/2017   TOTAL HIP ARTHROPLASTY Right 01/09/2017   Procedure: RIGHT TOTAL HIP  ARTHROPLASTY ANTERIOR APPROACH;  Surgeon: Samson Frederic, MD;  Location: [facility] OR;  Service: Orthopedics;  Laterality: Right;    No current facility-administered medications for this encounter.   Current Outpatient Medications  Medication Sig Dispense Refill Last Dose/Taking   aspirin EC 81 MG tablet Take 81 mg by mouth in the morning.   Taking   ezetimibe (ZETIA) 10 MG tablet Take 10 mg by mouth in the morning.   Taking   losartan (COZAAR) 100 MG tablet Take 100 mg by mouth in the morning.   Taking   meloxicam (MOBIC) 15 MG tablet Take 15 mg by mouth in the morning.   Taking   metFORMIN (GLUCOPHAGE) 500 MG tablet Take 500 mg by mouth in the morning.   Taking   No Known Allergies  Social History   Tobacco Use   Smoking status: Former    Current packs/day: 0.00    Average packs/day: 0.5 packs/day for 10.0 years (5.0 ttl pk-yrs)    Types: Cigarettes    Start date: 47    Quit date: 1989    Years since quitting: 35.9   Smokeless tobacco: Never   Tobacco comments:    "quit smoking in the 1980s"  Substance Use Topics   Alcohol use: Not Currently    Comment: once every year    Family History  Problem Relation Age of Onset   Heart disease Mother    Leukemia Brother    Heart attack Brother    Colon cancer Neg Hx    Colon polyps Neg Hx    Esophageal cancer Neg Hx  Rectal cancer Neg Hx    Stomach cancer Neg Hx      Review of Systems  Musculoskeletal:  Positive for gait problem.  All other systems reviewed and are negative.   Objective:  Physical Exam Vitals reviewed.  Constitutional:      Appearance: Normal appearance. He is obese.  HENT:     Head: Normocephalic and atraumatic.  Eyes:     Extraocular Movements: Extraocular movements intact.     Pupils: Pupils are equal, round, and reactive to light.  Cardiovascular:     Rate and Rhythm: Normal rate and regular rhythm.     Pulses: Normal pulses.  Pulmonary:     Effort: Pulmonary effort is normal.     Breath  sounds: Normal breath sounds.  Abdominal:     Palpations: Abdomen is soft.  Musculoskeletal:     Cervical back: Normal range of motion and neck supple.     Left hip: Tenderness and bony tenderness present. Decreased range of motion. Decreased strength.  Neurological:     Mental Status: He is alert and oriented to person, place, and time.  Psychiatric:        Behavior: Behavior normal.     Vital signs in last 24 hours:    Labs:   Estimated body mass index is 34.41 kg/m as calculated from the following:   Height as of 04/14/23: 5' 5.5" (1.664 m).   Weight as of 04/14/23: 95.3 kg.   Imaging Review Plain radiographs demonstrate severe degenerative joint disease of the left hip(s). The bone quality appears to be excellent for age and reported activity level.      Assessment/Plan:  End stage arthritis, left hip(s)  The patient history, physical examination, clinical judgement of the provider and imaging studies are consistent with end stage degenerative joint disease of the left hip(s) and total hip arthroplasty is deemed medically necessary. The treatment options including medical management, injection therapy, arthroscopy and arthroplasty were discussed at length. The risks and benefits of total hip arthroplasty were presented and reviewed. The risks due to aseptic loosening, infection, stiffness, dislocation/subluxation,  thromboembolic complications and other imponderables were discussed.  The patient acknowledged the explanation, agreed to proceed with the plan and consent was signed. Patient is being admitted for inpatient treatment for surgery, pain control, PT, OT, prophylactic antibiotics, VTE prophylaxis, progressive ambulation and ADL's and discharge planning.The patient is planning to be discharged home with home health services

## 2023-04-21 ENCOUNTER — Encounter (HOSPITAL_COMMUNITY): Payer: Self-pay | Admitting: Orthopaedic Surgery

## 2023-04-21 ENCOUNTER — Ambulatory Visit (HOSPITAL_COMMUNITY): Payer: Medicare HMO | Admitting: Anesthesiology

## 2023-04-21 ENCOUNTER — Observation Stay (HOSPITAL_COMMUNITY): Payer: Medicare HMO

## 2023-04-21 ENCOUNTER — Observation Stay (HOSPITAL_COMMUNITY)
Admission: RE | Admit: 2023-04-21 | Discharge: 2023-04-22 | Disposition: A | Discharge status: Home / Self Care | Payer: Medicare HMO | Attending: Orthopaedic Surgery | Admitting: Orthopaedic Surgery

## 2023-04-21 ENCOUNTER — Other Ambulatory Visit: Payer: Self-pay

## 2023-04-21 ENCOUNTER — Ambulatory Visit (HOSPITAL_BASED_OUTPATIENT_CLINIC_OR_DEPARTMENT_OTHER): Payer: Medicare HMO | Admitting: Anesthesiology

## 2023-04-21 ENCOUNTER — Encounter (HOSPITAL_COMMUNITY)
Admission: RE | Disposition: A | Discharge status: Home / Self Care | Payer: Self-pay | Source: Home / Self Care | Attending: Orthopaedic Surgery

## 2023-04-21 ENCOUNTER — Ambulatory Visit (HOSPITAL_COMMUNITY): Payer: Medicare HMO

## 2023-04-21 DIAGNOSIS — J45909 Unspecified asthma, uncomplicated: Secondary | ICD-10-CM | POA: Insufficient documentation

## 2023-04-21 DIAGNOSIS — Z96643 Presence of artificial hip joint, bilateral: Secondary | ICD-10-CM | POA: Diagnosis not present

## 2023-04-21 DIAGNOSIS — Z79890 Hormone replacement therapy (postmenopausal): Secondary | ICD-10-CM | POA: Insufficient documentation

## 2023-04-21 DIAGNOSIS — M1612 Unilateral primary osteoarthritis, left hip: Secondary | ICD-10-CM | POA: Diagnosis not present

## 2023-04-21 DIAGNOSIS — Z7982 Long term (current) use of aspirin: Secondary | ICD-10-CM | POA: Diagnosis not present

## 2023-04-21 DIAGNOSIS — Z87891 Personal history of nicotine dependence: Secondary | ICD-10-CM | POA: Diagnosis not present

## 2023-04-21 DIAGNOSIS — Z79899 Other long term (current) drug therapy: Secondary | ICD-10-CM | POA: Insufficient documentation

## 2023-04-21 DIAGNOSIS — E119 Type 2 diabetes mellitus without complications: Secondary | ICD-10-CM | POA: Diagnosis not present

## 2023-04-21 DIAGNOSIS — I10 Essential (primary) hypertension: Secondary | ICD-10-CM | POA: Insufficient documentation

## 2023-04-21 DIAGNOSIS — Z96641 Presence of right artificial hip joint: Secondary | ICD-10-CM | POA: Insufficient documentation

## 2023-04-21 DIAGNOSIS — Z96642 Presence of left artificial hip joint: Secondary | ICD-10-CM

## 2023-04-21 DIAGNOSIS — Z471 Aftercare following joint replacement surgery: Secondary | ICD-10-CM | POA: Diagnosis not present

## 2023-04-21 HISTORY — PX: TOTAL HIP ARTHROPLASTY: SHX124

## 2023-04-21 LAB — TYPE AND SCREEN
ABO/RH(D): A POS
Antibody Screen: NEGATIVE

## 2023-04-21 LAB — GLUCOSE, CAPILLARY
Glucose-Capillary: 102 mg/dL — ABNORMAL HIGH (ref 70–99)
Glucose-Capillary: 100 mg/dL — ABNORMAL HIGH (ref 70–99)

## 2023-04-21 SURGERY — ARTHROPLASTY, HIP, TOTAL, ANTERIOR APPROACH
Anesthesia: Monitor Anesthesia Care | Site: Hip | Laterality: Left

## 2023-04-21 MED ORDER — METHOCARBAMOL 1000 MG/10ML IJ SOLN
500.0000 mg | Freq: Four times a day (QID) | INTRAMUSCULAR | Status: DC | PRN
  Administered 2023-04-21: 500 mg via INTRAVENOUS
  Filled 2023-04-21: qty 10

## 2023-04-21 MED ORDER — DOCUSATE SODIUM 100 MG PO CAPS
100.0000 mg | ORAL_CAPSULE | Freq: Two times a day (BID) | ORAL | Status: DC
  Administered 2023-04-21: 100 mg via ORAL
  Filled 2023-04-21: qty 1

## 2023-04-21 MED ORDER — STERILE WATER FOR IRRIGATION IR SOLN
Status: DC | PRN
  Administered 2023-04-21: 1000 mL

## 2023-04-21 MED ORDER — METOCLOPRAMIDE HCL 5 MG/ML IJ SOLN: 5.0000 mg | Freq: Three times a day (TID) | INTRAMUSCULAR | Status: DC | PRN

## 2023-04-21 MED ORDER — PROPOFOL 500 MG/50ML IV EMUL
INTRAVENOUS | Status: DC | PRN
  Administered 2023-04-21: 100 ug/kg/min via INTRAVENOUS

## 2023-04-21 MED ORDER — METFORMIN HCL 500 MG PO TABS
500.0000 mg | ORAL_TABLET | Freq: Every day | ORAL | Status: DC
Start: 2023-04-22 — End: 2023-04-22
  Administered 2023-04-22: 500 mg via ORAL
  Filled 2023-04-21: qty 1

## 2023-04-21 MED ORDER — INSULIN ASPART 100 UNIT/ML IJ SOLN: 0.0000 [IU] | INTRAMUSCULAR | Status: DC | PRN

## 2023-04-21 MED ORDER — ONDANSETRON HCL 4 MG PO TABS: 4.0000 mg | ORAL_TABLET | Freq: Four times a day (QID) | ORAL | Status: DC | PRN

## 2023-04-21 MED ORDER — ACETAMINOPHEN 10 MG/ML IV SOLN: 1000.0000 mg | Freq: Once | INTRAVENOUS | Status: DC | PRN

## 2023-04-21 MED ORDER — OXYCODONE HCL 5 MG PO TABS
10.0000 mg | ORAL_TABLET | ORAL | Status: DC | PRN
Start: 2023-04-21 — End: 2023-04-22
  Administered 2023-04-21: 10 mg via ORAL
  Administered 2023-04-22: 15 mg via ORAL
  Filled 2023-04-21: qty 3

## 2023-04-21 MED ORDER — PROPOFOL 1000 MG/100ML IV EMUL
INTRAVENOUS | Status: AC
  Filled 2023-04-21: qty 100

## 2023-04-21 MED ORDER — METOCLOPRAMIDE HCL 5 MG PO TABS: 5.0000 mg | ORAL_TABLET | Freq: Three times a day (TID) | ORAL | Status: DC | PRN

## 2023-04-21 MED ORDER — CEFAZOLIN SODIUM-DEXTROSE 2-4 GM/100ML-% IV SOLN
2.0000 g | Freq: Four times a day (QID) | INTRAVENOUS | Status: AC
  Administered 2023-04-21 (×2): 2 g via INTRAVENOUS
  Filled 2023-04-21 (×2): qty 100

## 2023-04-21 MED ORDER — LOSARTAN POTASSIUM 50 MG PO TABS
100.0000 mg | ORAL_TABLET | Freq: Every day | ORAL | Status: DC
Start: 2023-04-22 — End: 2023-04-22
  Administered 2023-04-22: 100 mg via ORAL
  Filled 2023-04-21: qty 2

## 2023-04-21 MED ORDER — CEFAZOLIN SODIUM-DEXTROSE 2-4 GM/100ML-% IV SOLN
2.0000 g | INTRAVENOUS | Status: AC
  Administered 2023-04-21: 2 g via INTRAVENOUS
  Filled 2023-04-21: qty 100

## 2023-04-21 MED ORDER — METHOCARBAMOL 500 MG PO TABS
500.0000 mg | ORAL_TABLET | Freq: Four times a day (QID) | ORAL | Status: DC | PRN
  Administered 2023-04-21 – 2023-04-22 (×2): 500 mg via ORAL
  Filled 2023-04-21 (×2): qty 1

## 2023-04-21 MED ORDER — BUPIVACAINE IN DEXTROSE 0.75-8.25 % IT SOLN
INTRATHECAL | Status: DC | PRN
  Administered 2023-04-21: 1.8 mL via INTRATHECAL

## 2023-04-21 MED ORDER — ASPIRIN 81 MG PO CHEW
81.0000 mg | CHEWABLE_TABLET | Freq: Two times a day (BID) | ORAL | Status: DC
  Administered 2023-04-21 – 2023-04-22 (×2): 81 mg via ORAL
  Filled 2023-04-21 (×2): qty 1

## 2023-04-21 MED ORDER — SODIUM CHLORIDE 0.9% FLUSH: 3.0000 mL | INTRAVENOUS | Status: DC | PRN

## 2023-04-21 MED ORDER — ONDANSETRON HCL 4 MG/2ML IJ SOLN
4.0000 mg | Freq: Four times a day (QID) | INTRAMUSCULAR | Status: DC | PRN
  Administered 2023-04-21: 4 mg via INTRAVENOUS
  Filled 2023-04-21: qty 2

## 2023-04-21 MED ORDER — PROPOFOL 500 MG/50ML IV EMUL
INTRAVENOUS | Status: AC
  Filled 2023-04-21: qty 50

## 2023-04-21 MED ORDER — MENTHOL 3 MG MT LOZG: 1.0000 | LOZENGE | OROMUCOSAL | Status: DC | PRN

## 2023-04-21 MED ORDER — HYDROMORPHONE HCL 1 MG/ML IJ SOLN
0.5000 mg | INTRAMUSCULAR | Status: DC | PRN
  Administered 2023-04-21: 1 mg via INTRAVENOUS
  Filled 2023-04-21: qty 1

## 2023-04-21 MED ORDER — TRANEXAMIC ACID-NACL 1000-0.7 MG/100ML-% IV SOLN
1000.0000 mg | INTRAVENOUS | Status: AC
  Administered 2023-04-21: 1000 mg via INTRAVENOUS
  Filled 2023-04-21: qty 100

## 2023-04-21 MED ORDER — 0.9 % SODIUM CHLORIDE (POUR BTL) OPTIME
TOPICAL | Status: DC | PRN
  Administered 2023-04-21: 1000 mL

## 2023-04-21 MED ORDER — PHENYLEPHRINE HCL-NACL 20-0.9 MG/250ML-% IV SOLN
INTRAVENOUS | Status: DC | PRN
  Administered 2023-04-21: 30 ug/min via INTRAVENOUS

## 2023-04-21 MED ORDER — EZETIMIBE 10 MG PO TABS
10.0000 mg | ORAL_TABLET | Freq: Every day | ORAL | Status: DC
  Administered 2023-04-22: 10 mg via ORAL
  Filled 2023-04-21: qty 1

## 2023-04-21 MED ORDER — MIDAZOLAM HCL 2 MG/2ML IJ SOLN
INTRAMUSCULAR | Status: AC
  Filled 2023-04-21: qty 2

## 2023-04-21 MED ORDER — OXYCODONE HCL 5 MG PO TABS
5.0000 mg | ORAL_TABLET | ORAL | Status: DC | PRN
  Administered 2023-04-21: 5 mg via ORAL
  Filled 2023-04-21: qty 1
  Filled 2023-04-21: qty 2

## 2023-04-21 MED ORDER — ONDANSETRON HCL 4 MG/2ML IJ SOLN
INTRAMUSCULAR | Status: DC | PRN
  Administered 2023-04-21: 4 mg via INTRAVENOUS

## 2023-04-21 MED ORDER — SODIUM CHLORIDE 0.9 % IR SOLN
Status: DC | PRN
  Administered 2023-04-21: 1000 mL

## 2023-04-21 MED ORDER — SODIUM CHLORIDE 0.9% FLUSH: 3.0000 mL | Freq: Two times a day (BID) | INTRAVENOUS | Status: DC

## 2023-04-21 MED ORDER — LACTATED RINGERS IV SOLN: INTRAVENOUS | Status: DC

## 2023-04-21 MED ORDER — DEXAMETHASONE SODIUM PHOSPHATE 10 MG/ML IJ SOLN
INTRAMUSCULAR | Status: DC | PRN
  Administered 2023-04-21: 8 mg via INTRAVENOUS

## 2023-04-21 MED ORDER — PHENOL 1.4 % MT LIQD: 1.0000 | OROMUCOSAL | Status: DC | PRN

## 2023-04-21 MED ORDER — ALUM & MAG HYDROXIDE-SIMETH 200-200-20 MG/5ML PO SUSP: 30.0000 mL | ORAL | Status: DC | PRN

## 2023-04-21 MED ORDER — ACETAMINOPHEN 325 MG PO TABS: 325.0000 mg | ORAL_TABLET | Freq: Four times a day (QID) | ORAL | Status: DC | PRN

## 2023-04-21 MED ORDER — ORAL CARE MOUTH RINSE
15.0000 mL | Freq: Once | OROMUCOSAL | Status: AC
Start: 2023-04-21 — End: 2023-04-21

## 2023-04-21 MED ORDER — CHLORHEXIDINE GLUCONATE 0.12 % MT SOLN
15.0000 mL | Freq: Once | OROMUCOSAL | Status: AC
  Administered 2023-04-21: 15 mL via OROMUCOSAL

## 2023-04-21 MED ORDER — MIDAZOLAM HCL 5 MG/5ML IJ SOLN
INTRAMUSCULAR | Status: DC | PRN
  Administered 2023-04-21: 2 mg via INTRAVENOUS

## 2023-04-21 MED ORDER — PANTOPRAZOLE SODIUM 40 MG PO TBEC
40.0000 mg | DELAYED_RELEASE_TABLET | Freq: Every day | ORAL | Status: DC
  Administered 2023-04-21 – 2023-04-22 (×2): 40 mg via ORAL
  Filled 2023-04-21 (×2): qty 1

## 2023-04-21 MED ORDER — DIPHENHYDRAMINE HCL 12.5 MG/5ML PO ELIX: 12.5000 mg | ORAL_SOLUTION | ORAL | Status: DC | PRN

## 2023-04-21 MED ORDER — POVIDONE-IODINE 10 % EX SWAB: 2.0000 | Freq: Once | CUTANEOUS | Status: DC

## 2023-04-21 MED ORDER — FENTANYL CITRATE PF 50 MCG/ML IJ SOSY: 25.0000 ug | PREFILLED_SYRINGE | INTRAMUSCULAR | Status: DC | PRN

## 2023-04-21 SURGICAL SUPPLY — 38 items
BAG COUNTER SPONGE SURGICOUNT (BAG) ×2 IMPLANT
BAG ZIPLOCK 12X15 (MISCELLANEOUS) IMPLANT
BENZOIN TINCTURE PRP APPL 2/3 (GAUZE/BANDAGES/DRESSINGS) IMPLANT
BLADE SAW SGTL 18X1.27X75 (BLADE) ×2 IMPLANT
COVER PERINEAL POST (MISCELLANEOUS) ×2 IMPLANT
COVER SURGICAL LIGHT HANDLE (MISCELLANEOUS) ×2 IMPLANT
CUP ACET PNNCL SECTR W/GRIP 56 (Hips) IMPLANT
CUP SECTOR GRIPTON 58MM (Orthopedic Implant) IMPLANT
DRAPE FOOT SWITCH (DRAPES) ×2 IMPLANT
DRAPE STERI IOBAN 125X83 (DRAPES) ×2 IMPLANT
DRAPE U-SHAPE 47X51 STRL (DRAPES) ×4 IMPLANT
DRSG AQUACEL AG ADV 3.5X10 (GAUZE/BANDAGES/DRESSINGS) ×2 IMPLANT
DURAPREP 26ML APPLICATOR (WOUND CARE) ×2 IMPLANT
ELECT REM PT RETURN 15FT ADLT (MISCELLANEOUS) ×2 IMPLANT
GAUZE XEROFORM 1X8 LF (GAUZE/BANDAGES/DRESSINGS) IMPLANT
GLOVE BIO SURGEON STRL SZ7.5 (GLOVE) ×2 IMPLANT
GLOVE BIOGEL PI IND STRL 8 (GLOVE) ×4 IMPLANT
GLOVE ECLIPSE 8.0 STRL XLNG CF (GLOVE) ×2 IMPLANT
GOWN STRL REUS W/ TWL XL LVL3 (GOWN DISPOSABLE) ×4 IMPLANT
HEAD CERAMIC 36 PLUS 8.5 12 14 (Hips) IMPLANT
HOLDER FOLEY CATH W/STRAP (MISCELLANEOUS) ×2 IMPLANT
KIT TURNOVER KIT A (KITS) IMPLANT
LINER NEUTRAL 36X58 PLUS4 IMPLANT
PACK ANTERIOR HIP CUSTOM (KITS) ×2 IMPLANT
PINN SECTOR W/GRIP ACE CUP 56 (Hips) ×1 IMPLANT
SCREW 6.5MMX30MM (Screw) IMPLANT
SET HNDPC FAN SPRY TIP SCT (DISPOSABLE) ×2 IMPLANT
STAPLER SKIN PROX WIDE 3.9 (STAPLE) IMPLANT
STEM FEM ACTIS HIGH SZ3 (Stem) IMPLANT
STRIP CLOSURE SKIN 1/2X4 (GAUZE/BANDAGES/DRESSINGS) IMPLANT
SUT ETHIBOND NAB CT1 #1 30IN (SUTURE) ×2 IMPLANT
SUT ETHILON 2 0 PS N (SUTURE) IMPLANT
SUT MNCRL AB 4-0 PS2 18 (SUTURE) IMPLANT
SUT VIC AB 0 CT1 36 (SUTURE) ×2 IMPLANT
SUT VIC AB 1 CT1 36 (SUTURE) ×2 IMPLANT
SUT VIC AB 2-0 CT1 TAPERPNT 27 (SUTURE) ×4 IMPLANT
TRAY FOLEY MTR SLVR 16FR STAT (SET/KITS/TRAYS/PACK) IMPLANT
YANKAUER SUCT BULB TIP NO VENT (SUCTIONS) ×2 IMPLANT

## 2023-04-21 NOTE — Anesthesia Preprocedure Evaluation (Signed)
Anesthesia Evaluation  Patient identified by MRN, date of birth, ID band Patient awake    Reviewed: Allergy & Precautions, NPO status , Patient's Chart, lab work & pertinent test results  Airway Mallampati: II  TM Distance: >3 FB Neck ROM: Full    Dental no notable dental hx.    Pulmonary asthma , former smoker   Pulmonary exam normal        Cardiovascular hypertension, Pt. on medications  Rhythm:Regular Rate:Normal     Neuro/Psych   Anxiety     negative neurological ROS     GI/Hepatic negative GI ROS, Neg liver ROS,,,  Endo/Other  diabetes, Type 2, Oral Hypoglycemic Agents    Renal/GU negative Renal ROS  negative genitourinary   Musculoskeletal  (+) Arthritis , Osteoarthritis,    Abdominal Normal abdominal exam  (+)   Peds  Hematology Lab Results      Component                Value               Date                      WBC                      5.5                 04/14/2023                HGB                      15.1                04/14/2023                HCT                      47.1                04/14/2023                MCV                      85.0                04/14/2023                PLT                      275                 04/14/2023              Anesthesia Other Findings   Reproductive/Obstetrics                             Anesthesia Physical Anesthesia Plan  ASA: 2  Anesthesia Plan: MAC and Spinal   Post-op Pain Management:    Induction: Intravenous  PONV Risk Score and Plan: 1 and Ondansetron, Dexamethasone, Propofol infusion, Treatment may vary due to age or medical condition and Midazolam  Airway Management Planned: Nasal Cannula and Simple Face Mask  Additional Equipment: None  Intra-op Plan:   Post-operative Plan:   Informed Consent: I have reviewed the patients History and Physical, chart, labs and discussed the procedure including the risks,  benefits and alternatives for the proposed anesthesia with the patient or authorized representative who has indicated his/her understanding and acceptance.     Dental advisory given  Plan Discussed with: CRNA  Anesthesia Plan Comments:        Anesthesia Quick Evaluation

## 2023-04-21 NOTE — TOC Transition Note (Signed)
Transition of Care [facility]) - Discharge Note   Patient Details  Name: Alex Hunter MRN: [id number] Date of Birth: [birth year]  [facility]) CM/SW Contact:  Amada Jupiter, LCSW Phone Number: 04/21/2023, 3:03 PM   Clinical Narrative:     Met with pt who confirms need for RW and no DME agency preference - order placed with [employer] [facility] (1:40pm) with request for delivery to room this afternoon.  HHPT prearranaged with [facility] via ortho MD office prior to surgery.  No further TOC needs.  Final next level of care: Home w Home Health Services Barriers to Discharge: No Barriers Identified   Patient Goals and CMS Choice Patient states their goals for this hospitalization and ongoing recovery are:: return home          Discharge Placement                       Discharge Plan and Services Additional resources added to the After Visit Summary for                  DME Arranged: Walker rolling DME Agency: [employer] Date DME Agency Contacted: 04/21/23 Time DME Agency Contacted: [phone] Representative spoke with at DME Agency: Ian Malkin HH Arranged: PT HH Agency: [facility]        Social Drivers of Health (SDOH) Interventions SDOH Screenings   Food Insecurity: No Food Insecurity (04/21/2023)  Housing: Low Risk  (04/21/2023)  Transportation Needs: No Transportation Needs (04/21/2023)  Utilities: Not At Risk (04/21/2023)  Tobacco Use: Medium Risk (04/21/2023)     Readmission Risk Interventions     No data to display

## 2023-04-21 NOTE — Evaluation (Signed)
Physical Therapy Evaluation Patient Details Name: Alex Hunter MRN: [id number] DOB: 07-10-1955 Today's Date: 04/21/2023  History of Present Illness  67 yo male presents to therapy s/p L THA, anterior approach on 04/21/2023 due to failure of conservative measures. Pt PMH includes but is not limited to: L trochanteric bursitis, HTN, HLD, DM II, and R THA (2018).  Clinical Impression   Alex Hunter is a 67 y.o. male POD 0 s/p L THA, AA. Patient reports INd with mobility at baseline. Patient is now limited by functional impairments (see PT problem list below) and requires increased time and CGA for bed mobility and CGA and min cues for transfers. Patient was unable to safely ambulate at time of eval due to reports of nausea, light headedness and feeling warm.  Pt reported once seated in recliner and B LE elevated light headedness nausea and warm sensation resolved. Nurse made PT aware pt had what appeared to be a vasovagal like response just after 1500 today when nursing staff assisting pt to EOB. Pt provided with pain and nausea medication following event.  Patient instructed in exercise to facilitate ROM and circulation to manage edema. Patient will benefit from continued skilled PT interventions to address impairments and progress towards PLOF. Acute PT will follow to progress mobility and stair training in preparation for safe discharge home with family and fiance support and HH services.       If plan is discharge home, recommend the following: A little help with walking and/or transfers;A little help with bathing/dressing/bathroom;Assistance with cooking/housework;Assist for transportation;Help with stairs or ramp for entrance   Can travel by private vehicle        Equipment Recommendations Rolling walker (2 wheels)  Recommendations for Other Services       Functional Status Assessment Patient has had a recent decline in their functional status and demonstrates the ability to  make significant improvements in function in a reasonable and predictable amount of time.     Precautions / Restrictions Precautions Precautions: Fall Restrictions Weight Bearing Restrictions Per Provider Order: No LLE Weight Bearing Per Provider Order: Weight bearing as tolerated      Mobility  Bed Mobility Overal bed mobility: Needs Assistance Bed Mobility: Supine to Sit     Supine to sit: Contact guard, HOB elevated, Used rails     General bed mobility comments: min cues and increased time    Transfers Overall transfer level: Needs assistance Equipment used: Rolling walker (2 wheels) Transfers: Sit to/from Stand, Bed to chair/wheelchair/BSC Sit to Stand: Contact guard assist Stand pivot transfers: Contact guard assist         General transfer comment: min cues for proper UE and AD placement, pt reported felling light headed and slightly nauseated and warm when in standing and directed to recliner vs able to progress with gait tasks at time of  eval    Ambulation/Gait               General Gait Details: NT  Stairs            Wheelchair Mobility     Tilt Bed    Modified Rankin (Stroke Patients Only)       Balance Overall balance assessment: Needs assistance Sitting-balance support: Feet supported Sitting balance-Leahy Scale: Good     Standing balance support: Bilateral upper extremity supported, During functional activity, Reliant on assistive device for balance Standing balance-Leahy Scale: Poor  Pertinent Vitals/Pain Pain Assessment Pain Assessment: 0-10 Pain Score: 2  Pain Descriptors / Indicators: Aching, Dull, Discomfort, Operative site guarding Pain Intervention(s): Limited activity within patient's tolerance, Monitored during session, Premedicated before session, Repositioned, Ice applied    Home Living Family/patient expects to be discharged to:: Private residence Living Arrangements:  Children;Spouse/significant other Available Help at Discharge: Family Type of Home: House Home Access: Level entry     Alternate Level Stairs-Number of Steps: 15 Home Layout: Two level;Bed/bath upstairs Home Equipment: None      Prior Function Prior Level of Function : Independent/Modified Independent;Working/employed;Driving             Mobility Comments: IND with all ADLs, self care tasks and IADLs       Extremity/Trunk Assessment        Lower Extremity Assessment Lower Extremity Assessment: LLE deficits/detail LLE Deficits / Details: ankle DF/PF 5/5 LLE Sensation: WNL    Cervical / Trunk Assessment Cervical / Trunk Assessment: Normal  Communication   Communication Communication: No apparent difficulties  Cognition Arousal: Alert Behavior During Therapy: WFL for tasks assessed/performed Overall Cognitive Status: Within Functional Limits for tasks assessed                                          General Comments      Exercises Total Joint Exercises Ankle Circles/Pumps: AROM, Both, 10 reps   Assessment/Plan    PT Assessment Patient needs continued PT services  PT Problem List Decreased strength;Decreased range of motion;Decreased activity tolerance;Decreased balance;Decreased mobility;Decreased coordination;Pain       PT Treatment Interventions DME instruction;Gait training;Functional mobility training;Stair training;Therapeutic activities;Therapeutic exercise;Balance training;Neuromuscular re-education;Patient/family education;Modalities    PT Goals (Current goals can be found in the Care Plan section)  Acute Rehab PT Goals Patient Stated Goal: to return to work PT Goal Formulation: With patient Time For Goal Achievement: 05/05/23 Potential to Achieve Goals: Good    Frequency 7X/week     Co-evaluation               AM-PAC PT "6 Clicks" Mobility  Outcome Measure Help needed turning from your back to your side while in  a flat bed without using bedrails?: A Little Help needed moving from lying on your back to sitting on the side of a flat bed without using bedrails?: A Little Help needed moving to and from a bed to a chair (including a wheelchair)?: A Little Help needed standing up from a chair using your arms (e.g., wheelchair or bedside chair)?: A Little Help needed to walk in hospital room?: Total Help needed climbing 3-5 steps with a railing? : Total 6 Click Score: 14    End of Session Equipment Utilized During Treatment: Gait belt Activity Tolerance: Treatment limited secondary to medical complications (Comment) (reports of light headeness, nausea and feeling warm) Patient left: in chair;with call bell/phone within reach;with chair alarm set Nurse Communication: Mobility status PT Visit Diagnosis: Unsteadiness on feet (R26.81);Other abnormalities of gait and mobility (R26.89);Muscle weakness (generalized) (M62.81);Pain;Difficulty in walking, not elsewhere classified (R26.2) Pain - Right/Left: Right Pain - part of body: Hip;Leg    Time: 7425-9563 PT Time Calculation (min) (ACUTE ONLY): 26 min   Charges:   PT Evaluation $PT Eval Low Complexity: 1 Low PT Treatments $Therapeutic Activity: 8-22 mins PT General Charges $$ ACUTE PT VISIT: 1 Visit         Johnny Bridge, PT  Acute Rehab   Jacqualyn Posey 04/21/2023, 4:38 PM

## 2023-04-21 NOTE — Plan of Care (Signed)
  Problem: Education: Goal: Knowledge of General Education information will improve Description: Including pain rating scale, medication(s)/side effects and non-pharmacologic comfort measures Outcome: Progressing   Problem: Health Behavior/Discharge Planning: Goal: Ability to manage health-related needs will improve Outcome: Progressing   Problem: Activity: Goal: Risk for activity intolerance will decrease Outcome: Progressing   Problem: Pain Management: Goal: General experience of comfort will improve Outcome: Progressing

## 2023-04-21 NOTE — Interval H&P Note (Signed)
History and Physical Interval Note: The patient understands that he is here today for a left total hip replacement to treat his severe left hip arthritis.  There has been no acute or interval change in his medical status.  The risks and benefits of surgery have been discussed in detail and informed consent has been obtained.  The left operative hip has been marked.  04/21/2023 6:59 AM  Alex Hunter  has presented today for surgery, with the diagnosis of osteoarthritis left hip.  The various methods of treatment have been discussed with the patient and family. After consideration of risks, benefits and other options for treatment, the patient has consented to  Procedure(s): LEFT TOTAL HIP ARTHROPLASTY ANTERIOR APPROACH (Left) as a surgical intervention.  The patient's history has been reviewed, patient examined, no change in status, stable for surgery.  I have reviewed the patient's chart and labs.  Questions were answered to the patient's satisfaction.     Kathryne Hitch

## 2023-04-21 NOTE — Anesthesia Procedure Notes (Signed)
Procedure Name: MAC Date/Time: 04/21/2023 8:30 AM  Performed by: Vanessa Altona, CRNAPre-anesthesia Checklist: Patient identified, Emergency Drugs available, Suction available and Patient being monitored Patient Re-evaluated:Patient Re-evaluated prior to induction Oxygen Delivery Method: Simple face mask

## 2023-04-21 NOTE — Anesthesia Procedure Notes (Signed)
Spinal  Patient location during procedure: OR Start time: 04/21/2023 8:28 AM End time: 04/21/2023 8:31 AM Staffing Performed: anesthesiologist  Anesthesiologist: Atilano Median, DO Performed by: Atilano Median, DO Authorized by: Atilano Median, DO   Preanesthetic Checklist Completed: patient identified, IV checked, site marked, risks and benefits discussed, surgical consent, monitors and equipment checked, pre-op evaluation and timeout performed Spinal Block Patient position: sitting Prep: DuraPrep Patient monitoring: heart rate, cardiac monitor, continuous pulse ox and blood pressure Approach: midline Location: L3-4 Injection technique: single-shot Needle Needle type: Pencan  Needle gauge: 24 G Needle length: 10 cm Assessment Events: CSF return and second provider Additional Notes Patient identified. Risks/Benefits/Options discussed with patient including but not limited to bleeding, infection, nerve damage, paralysis, failed block, incomplete pain control, headache, blood pressure changes, nausea, vomiting, reactions to medications, itching and postpartum back pain. Confirmed with bedside nurse the patient's most recent platelet count. Confirmed with patient that they are not currently taking any anticoagulation, have any bleeding history or any family history of bleeding disorders. Patient expressed understanding and wished to proceed. All questions were answered. Sterile technique was used throughout the entire procedure. Please see nursing notes for vital signs. Warning signs of high block given to the patient including shortness of breath, tingling/numbness in hands, complete motor block, or any concerning symptoms with instructions to call for help. Patient was given instructions on fall risk and not to get out of bed. All questions and concerns addressed with instructions to call with any issues or inadequate analgesia.

## 2023-04-21 NOTE — Op Note (Signed)
Operative Note  Date of operation: 04/21/2023 Preoperative diagnosis: Left hip primary osteoarthritis Postoperative diagnosis: Same  Procedure: Left direct anterior total hip arthroplasty  Implants: Implant Name Type Inv. Item Serial No. Manufacturer Lot No. LRB No. Used Action  CUP SECTOR GRIPTON - [id number] Orthopedic Implant CUP SECTOR GRIPTON  [employer]PU[employer]TH[employer] [id number] Left 1 Implanted  SCREW 6.5MMX30MM - [id number] Screw SCREW 6.5MMX30MM  DEPUY ORTHOPAEDICS [id number] Left 1 Implanted  LINER NEUTRAL 36X58 PLUS4 - [id number]  LINER NEUTRAL 36X58 PLUS4  DEPUY ORTHOPAEDICS [id number] Left 1 Implanted  STEM FEM ACTIS HIGH SZ3 - [id number] Stem STEM FEM ACTIS HIGH SZ3  DEPUY ORTHOPAED[employer] [id number] Left 1 Implanted  HEAD CERAMIC 36 PLUS 8.5 12 14  - [id number] Hips HEAD CERAMIC 36 PLUS 8.5 12 14   DEPUY ORTHOPA[employer] [id number] Left 1 Implanted   Surgeon: Vanita Panda. Magnus Ivan, MD Assistant: Rexene Edison, PA-C  Anesthesia: Spinal EBL: 100 cc Antibiotics: IV Ancef Complications: None  Indications: The patient is a 67 year old gentleman with debilitating arthritis involving his left hip.  This is been worsening for several years now.  He does have a remote history of a right total hip arthroplasty performed by one of my colleagues in town in 2018.  Ever since then he has had a significant leg length discrepancy with his right side longer than left.  Now with time he is developed worsening arthritis in his left hip and is in need of a hip replacement on the left side.  He has well-documented osteoarthritis of left hip.  He has tried and failed all forms conservative treatment.  At this point his left hip pain is daily and it is detrimentally affecting his mobility, his quality of life and his actives daily living.  He understands having had this before that there are risk of acute blood loss anemia, nerve or vessel injury, fracture, infection, DVT, dislocation, implant failure, leg length  differences and wound healing issues.  He understands that our goals are hopefully decreased pain, improved mobility, and improved quality of life.  Procedure description: After informed consent was obtained and the appropriate left hip was marked, the patient was brought to the operating room and set up on the stretcher where spinal anesthesia was obtained.  He was then laid in a supine position on the stretcher and a Foley catheter was placed.  Traction boots were placed on both his feet and he was placed supine on the Hana fracture table with a perineal post in place in both legs and inline skeletal traction devices but no traction applied.  His left operative hip and pelvis were assessed radiographically.  The left hip was prepped and draped with DuraPrep and sterile drapes.  A timeout was called and he was identified as the correct patient the correct left hip.  An incision was then made just inferior and posterior to the ASIS and carried slightly obliquely down the leg.  Dissection was carried down to the tensor fascia lata muscle and the tensor fascia was then divided longitudinally to proceed with a direct anterior approach to the hip.  Circumflex vessels were identified and cauterized.  The hip capsule was identified and opened up in L-type format.  Cobra retractors were placed around the medial and lateral femoral neck and a femoral neck cut was made with an oscillating saw just proximal to the lesser trochanter and this was completed with an osteotome.  A corkscrew guide was placed in the femoral head and the femoral head was removed its entirety.  Was a wide area of cartilage wear noted at the weightbearing surface of the femoral head and acetabulum.  A bent Hohmann was then placed over the medial acetabular rim and remnants of the acetabulum and other debris removed.  We then reamed from stepwise increments under direct visualization with a size 43 reamer going all the way up to a size 55 reamer with  all reamers placed on direct visualization and the last reamer also placed under direct fluoroscopy in order to obtain the depth reaming, the inclination and the anteversion.  We then placed the real DePuy sector GRIPTION Astra component size 56 but I could not get it to seat so he went up to a size 56 reamer and I still could not get that size 56 acetabular component to bite into the bone.  We decided to go up to a size 58 acetabular component and that had excellent fit when we put it in place.  We put it in place on direct visitation and fluoroscopy and we still used a single screw in the superior lateral aspect of the acetabular component.  We then placed a 36+4 polythene liner for that size 58*component.  Attention was then turned the femur.  With the leg externally rotated to 120 degrees, extended and adducted, we were able to place a medial retractor medially and a Hohmann retractor by the greater trochanter.  The lateral joint capsule was released and a box cutting osteotome was used in her femoral canal.  Broaching was then initiated using the Actis broaching system from a size 0 going to a size 3.  With a size 3 in place we trialed a standard offset femoral neck and a 36+1.5 trial hip ball.  We brought the left leg over and up and with traction and internal rotation reduced in the pelvis.  Based on radiographic and clinical assessment we needed more offset and leg length.  We dislocated the hip remove the trial components.  We placed the real Actis femoral component size 3 with high offset and went with the real 36+8.5 ceramic head ball.  Again this was used in the acetabulum and we are pleased with range of motion and stability assessed mechanically and radiographically as well as likely than offset.  The soft tissue was then irrigated with normal saline solution.  Remnants of the joint capsule were closed with interrupted #1 Ethibond suture.  A #1 Vicryl was used to close the tensor fascia.  0 Vicryl was  used to close the deep tissue and 2-0 Vicryl was used to close subcutaneous tissue.  The skin was closed with staples.  An Aquacel dressing was applied.  The patient was taken off the Hana table and taken to the recovery room in stable condition.  Rexene Edison, PA-C assisted in entire case and beginning to end and his assistance was medically necessary and crucial for soft tissue management and retraction, helping guide implant placement and a layered closure of the wound.

## 2023-04-21 NOTE — Transfer of Care (Signed)
Immediate Anesthesia Transfer of Care Note  Patient: Alex Hunter  Procedure(s) Performed: LEFT TOTAL HIP ARTHROPLASTY ANTERIOR APPROACH (Left: Hip)  Patient Location: PACU  Anesthesia Type:Spinal  Level of Consciousness: drowsy  Airway & Oxygen Therapy: Patient Spontanous Breathing and Patient connected to face mask  Post-op Assessment: Report given to RN and Post -op Vital signs reviewed and stable  Post vital signs: Reviewed and stable  Last Vitals:  Vitals Value Taken Time  BP 95/49 04/21/23 1015  Temp    Pulse 78 04/21/23 1017  Resp 11 04/21/23 1017  SpO2 100 % 04/21/23 1017  Vitals shown include unfiled device data.  Last Pain:  Vitals:   04/21/23 0635  TempSrc: Oral  PainSc:          Complications: No notable events documented.

## 2023-04-21 NOTE — Anesthesia Postprocedure Evaluation (Signed)
Anesthesia Post Note  Patient: Alex Hunter  Procedure(s) Performed: LEFT TOTAL HIP ARTHROPLASTY ANTERIOR APPROACH (Left: Hip)     Patient location during evaluation: PACU Anesthesia Type: MAC and Spinal Level of consciousness: awake and alert Pain management: pain level controlled Vital Signs Assessment: post-procedure vital signs reviewed and stable Respiratory status: spontaneous breathing, nonlabored ventilation, respiratory function stable and patient connected to nasal cannula oxygen Cardiovascular status: stable and blood pressure returned to baseline Postop Assessment: no apparent nausea or vomiting Anesthetic complications: no   No notable events documented.  Last Vitals:  Vitals:   04/21/23 1200 04/21/23 1241  BP: 134/82 123/82  Pulse: 63 61  Resp: 12 18  Temp:  36.4 C  SpO2: 99% 98%    Last Pain:  Vitals:   04/21/23 1241  TempSrc: Oral  PainSc: 1                  Hildy Nicholl P Jaqualyn Juday

## 2023-04-22 DIAGNOSIS — I10 Essential (primary) hypertension: Secondary | ICD-10-CM | POA: Diagnosis not present

## 2023-04-22 DIAGNOSIS — E119 Type 2 diabetes mellitus without complications: Secondary | ICD-10-CM | POA: Diagnosis not present

## 2023-04-22 DIAGNOSIS — J45909 Unspecified asthma, uncomplicated: Secondary | ICD-10-CM | POA: Diagnosis not present

## 2023-04-22 DIAGNOSIS — Z96641 Presence of right artificial hip joint: Secondary | ICD-10-CM | POA: Diagnosis not present

## 2023-04-22 DIAGNOSIS — Z79899 Other long term (current) drug therapy: Secondary | ICD-10-CM | POA: Diagnosis not present

## 2023-04-22 DIAGNOSIS — Z87891 Personal history of nicotine dependence: Secondary | ICD-10-CM | POA: Diagnosis not present

## 2023-04-22 DIAGNOSIS — Z79890 Hormone replacement therapy (postmenopausal): Secondary | ICD-10-CM | POA: Diagnosis not present

## 2023-04-22 DIAGNOSIS — Z7982 Long term (current) use of aspirin: Secondary | ICD-10-CM | POA: Diagnosis not present

## 2023-04-22 DIAGNOSIS — M1612 Unilateral primary osteoarthritis, left hip: Secondary | ICD-10-CM | POA: Diagnosis not present

## 2023-04-22 LAB — CBC
HCT: 41.3 % (ref 39.0–52.0)
Hemoglobin: 13.3 g/dL (ref 13.0–17.0)
MCH: 27.7 pg (ref 26.0–34.0)
MCHC: 32.2 g/dL (ref 30.0–36.0)
MCV: 85.9 fL (ref 80.0–100.0)
Platelets: 242 10*3/uL (ref 150–400)
RBC: 4.81 MIL/uL (ref 4.22–5.81)
RDW: 15.2 % (ref 11.5–15.5)
WBC: 13.5 10*3/uL — ABNORMAL HIGH (ref 4.0–10.5)
nRBC: 0 % (ref 0.0–0.2)

## 2023-04-22 LAB — BASIC METABOLIC PANEL
Anion gap: 9 (ref 5–15)
BUN: 19 mg/dL (ref 8–23)
CO2: 25 mmol/L (ref 22–32)
Calcium: 8.8 mg/dL — ABNORMAL LOW (ref 8.9–10.3)
Chloride: 103 mmol/L (ref 98–111)
Creatinine, Ser: 1.09 mg/dL (ref 0.61–1.24)
GFR, Estimated: >60 mL/min (ref >60)
Glucose, Bld: 169 mg/dL — ABNORMAL HIGH (ref 70–99)
Potassium: 4.4 mmol/L (ref 3.5–5.1)
Sodium: 137 mmol/L (ref 135–145)

## 2023-04-22 MED ORDER — TIZANIDINE HCL 4 MG PO TABS: 4.0000 mg | ORAL_TABLET | Freq: Four times a day (QID) | ORAL | 1 refills | Status: DC | PRN

## 2023-04-22 MED ORDER — ONDANSETRON 4 MG PO TBDP: 4.0000 mg | ORAL_TABLET | Freq: Three times a day (TID) | ORAL | 0 refills | Status: AC | PRN

## 2023-04-22 MED ORDER — OXYCODONE HCL 5 MG PO TABS: 5.0000 mg | ORAL_TABLET | Freq: Four times a day (QID) | ORAL | 0 refills | Status: DC | PRN

## 2023-04-22 MED ORDER — ASPIRIN 81 MG PO CHEW: 81.0000 mg | CHEWABLE_TABLET | Freq: Two times a day (BID) | ORAL | 0 refills | Status: AC

## 2023-04-22 NOTE — Progress Notes (Signed)
Physical Therapy Treatment Patient Details Name: Alex Hunter MRN: [id number] DOB: 1955-06-10 Today's Date: 04/22/2023   History of Present Illness 67 yo male presents to therapy s/p L THA, anterior approach on 04/21/2023 due to failure of conservative measures. Pt PMH includes but is not limited to: L trochanteric bursitis, HTN, HLD, DM II, and R THA (2018).    PT Comments  POD # 1 am session Pt AxO x 3 pleasant and motivated.  Assisted OOB to amb in hallway and practice stairs.  General Gait Details: tolerated a functional distance with walker and Fiancee "hands on" using safety belt. General stair comments: pt has entry level into house and B&B is upstairs.  Practiced multiple steps using one rail and one crutch with Fiancee "hands on" assisted  using safety belt with 50% VC's on proper tech and safety. Pt will need another PT session to Educate on HEP.   If plan is discharge home, recommend the following: A little help with walking and/or transfers;A little help with bathing/dressing/bathroom;Assistance with cooking/housework;Assist for transportation;Help with stairs or ramp for entrance   Can travel by private vehicle        Equipment Recommendations  Rolling walker (2 wheels);BSC/3in1    Recommendations for Other Services       Precautions / Restrictions Precautions Precautions: Fall Restrictions Weight Bearing Restrictions Per Provider Order: No LLE Weight Bearing Per Provider Order: Weight bearing as tolerated     Mobility  Bed Mobility Overal bed mobility: Needs Assistance Bed Mobility: Supine to Sit     Supine to sit: Supervision, Contact guard     General bed mobility comments: demonstarted and instructed how to use a belt to self assist    Transfers Overall transfer level: Needs assistance Equipment used: Rolling walker (2 wheels) Transfers: Sit to/from Stand, Bed to chair/wheelchair/BSC Sit to Stand: Supervision           General transfer  comment: 25% VC's on proper tech and safety with turns.    Ambulation/Gait Ambulation/Gait assistance: Supervision Gait Distance (Feet): 95 Feet Assistive device: Rolling walker (2 wheels) Gait Pattern/deviations: Step-to pattern, Decreased stance time - left Gait velocity: decreased     General Gait Details: tolerated a functional distance with walker and Fiancee "hands on" using safety belt.   Stairs Stairs: Yes Stairs assistance: Supervision, Contact guard assist Stair Management: One rail Right Number of Stairs: 4 General stair comments: pt has entry level into house and B&B is upstairs.  Practiced multiple steps using one rail and one crutch with Fiancee "hands on" assisted  using safety belt with 50% VC's on proper tech and safety.   Wheelchair Mobility     Tilt Bed    Modified Rankin (Stroke Patients Only)       Balance                                            Cognition Arousal: Alert Behavior During Therapy: WFL for tasks assessed/performed Overall Cognitive Status: Within Functional Limits for tasks assessed                                 General Comments: AxO x 3 pleasant and motivated.        Exercises      General Comments        Pertinent  Vitals/Pain Pain Assessment Pain Assessment: 0-10 Pain Score: 6  Pain Location: L hip Pain Descriptors / Indicators: Aching, Dull, Discomfort, Operative site guarding Pain Intervention(s): Monitored during session, Premedicated before session, Repositioned, Ice applied    Home Living                          Prior Function            PT Goals (current goals can now be found in the care plan section) Progress towards PT goals: Progressing toward goals    Frequency    7X/week      PT Plan      Co-evaluation              AM-PAC PT "6 Clicks" Mobility   Outcome Measure  Help needed turning from your back to your side while in a flat bed  without using bedrails?: None Help needed moving from lying on your back to sitting on the side of a flat bed without using bedrails?: None Help needed moving to and from a bed to a chair (including a wheelchair)?: None Help needed standing up from a chair using your arms (e.g., wheelchair or bedside chair)?: None Help needed to walk in hospital room?: A Little Help needed climbing 3-5 steps with a railing? : A Little 6 Click Score: 22    End of Session Equipment Utilized During Treatment: Gait belt Activity Tolerance: Treatment limited secondary to medical complications (Comment) Patient left: in chair;with call bell/phone within reach;with chair alarm set Nurse Communication: Mobility status PT Visit Diagnosis: Unsteadiness on feet (R26.81);Other abnormalities of gait and mobility (R26.89);Muscle weakness (generalized) (M62.81);Pain;Difficulty in walking, not elsewhere classified (R26.2) Pain - Right/Left: Left Pain - part of body: Hip;Leg     Time: 0935-1010 PT Time Calculation (min) (ACUTE ONLY): 35 min  Charges:    $Gait Training: 8-22 mins $Therapeutic Activity: 8-22 mins PT General Charges $$ ACUTE PT VISIT: 1 Visit                     Felecia Shelling  PTA Acute  Rehabilitation Services Office M-F          [phone]

## 2023-04-22 NOTE — Discharge Instructions (Signed)

## 2023-04-22 NOTE — Plan of Care (Signed)
Patient discharging home via private vehicle with fiance. AVS and discharge instruction provided. Patient verbalizes understanding. Haydee Salter, RN 04/22/23 1:09 PM

## 2023-04-22 NOTE — Progress Notes (Signed)
Physical Therapy Treatment Patient Details Name: Alex Hunter MRN: [id number] DOB: 02-14-1956 Today's Date: 04/22/2023   History of Present Illness 67 yo male presents to therapy s/p L THA, anterior approach on 04/21/2023 due to failure of conservative measures. Pt PMH includes but is not limited to: L trochanteric bursitis, HTN, HLD, DM II, and R THA (2018).    PT Comments  POD # 1 pm session Educated and performed all TE's following HEP following handout.  Issued ONE crutch for "stairs only".   Addressed all mobility questions, discussed appropriate activity, educated on use of ICE.  Pt ready for D/C to home.  RN notified.   RW delivered, waiting on [facility].     If plan is discharge home, recommend the following: A little help with walking and/or transfers;A little help with bathing/dressing/bathroom;Assistance with cooking/housework;Assist for transportation;Help with stairs or ramp for entrance   Can travel by private vehicle        Equipment Recommendations  Rolling walker (2 wheels);BSC/3in1    Recommendations for Other Services       Precautions / Restrictions Precautions Precautions: Fall Restrictions Weight Bearing Restrictions Per Provider Order: No LLE Weight Bearing Per Provider Order: Weight bearing as tolerated     Mobility  Bed Mobility Overal bed mobility: Needs Assistance Bed Mobility: Supine to Sit     Supine to sit: Supervision, Contact guard     General bed mobility comments: OOB in recliner    Transfers Overall transfer level: Needs assistance Equipment used: Rolling walker (2 wheels) Transfers: Sit to/from Stand, Bed to chair/wheelchair/BSC Sit to Stand: Supervision           General transfer comment: <25% VC's on proper tech and safety with turns.    Ambulation/Gait Ambulation/Gait assistance: Supervision Gait Distance (Feet): 28 Feet Assistive device: Rolling walker (2 wheels) Gait Pattern/deviations: Step-to pattern, Decreased  stance time - left Gait velocity: decreased     General Gait Details: decreased amb distance to and from bathroom as Tx session focused on HEP.   Stairs Stairs: Yes Stairs assistance: Supervision, Contact guard assist Stair Management: One rail Right Number of Stairs: 4 General stair comments: pt has entry level into house and B&B is upstairs.  Practiced multiple steps using one rail and one crutch with Fiancee "hands on" assisted  using safety belt with 50% VC's on proper tech and safety.   Wheelchair Mobility     Tilt Bed    Modified Rankin (Stroke Patients Only)       Balance                                            Cognition Arousal: Alert Behavior During Therapy: WFL for tasks assessed/performed Overall Cognitive Status: Within Functional Limits for tasks assessed                                 General Comments: AxO x 3 pleasant and motivated.        Exercises  Total Hip Replacement TE's following HEP Handout 10 reps ankle pumps 05 reps knee presses 05 reps heel slides 05 reps SAQ's 05 reps ABD Instructed how to use a belt loop to assist  Followed by ICE     General Comments        Pertinent Vitals/Pain Pain Assessment Pain Assessment:  0-10 Pain Score: 6  Pain Location: L hip Pain Descriptors / Indicators: Aching, Dull, Discomfort, Operative site guarding Pain Intervention(s): Monitored during session, Premedicated before session, Repositioned, Ice applied    Home Living                          Prior Function            PT Goals (current goals can now be found in the care plan section) Progress towards PT goals: Progressing toward goals    Frequency    7X/week      PT Plan      Co-evaluation              AM-PAC PT "6 Clicks" Mobility   Outcome Measure  Help needed turning from your back to your side while in a flat bed without using bedrails?: None Help needed moving from  lying on your back to sitting on the side of a flat bed without using bedrails?: None Help needed moving to and from a bed to a chair (including a wheelchair)?: None Help needed standing up from a chair using your arms (e.g., wheelchair or bedside chair)?: None Help needed to walk in hospital room?: A Little Help needed climbing 3-5 steps with a railing? : A Little 6 Click Score: 22    End of Session Equipment Utilized During Treatment: Gait belt Activity Tolerance: Treatment limited secondary to medical complications (Comment) Patient left: in chair;with call bell/phone within reach;with chair alarm set Nurse Communication: Mobility status PT Visit Diagnosis: Unsteadiness on feet (R26.81);Other abnormalities of gait and mobility (R26.89);Muscle weakness (generalized) (M62.81);Pain;Difficulty in walking, not elsewhere classified (R26.2) Pain - Right/Left: Left Pain - part of body: Hip;Leg     Time: 0102-7253 PT Time Calculation (min) (ACUTE ONLY): 24 min  Charges:    $Gait Training: 8-22 mins $Therapeutic Exercise: 8-22 mins PT General Charges $$ ACUTE PT VISIT: 1 Visit                     Felecia Shelling  PTA Acute  Rehabilitation Services Office M-F          [phone]

## 2023-04-22 NOTE — Plan of Care (Signed)
Problem: Education: Goal: Knowledge of General Education information will improve Description: Including pain rating scale, medication(s)/side effects and non-pharmacologic comfort measures Outcome: Progressing   Problem: Health Behavior/Discharge Planning: Goal: Ability to manage health-related needs will improve Outcome: Progressing   Problem: Clinical Measurements: Goal: Ability to maintain clinical measurements within normal limits will improve Outcome: Progressing   Problem: Activity: Goal: Risk for activity intolerance will decrease Outcome: Progressing   Problem: Pain Management: Goal: General experience of comfort will improve Outcome: Progressing   Haydee Salter, RN 04/22/23 11:35 AM

## 2023-04-22 NOTE — Discharge Summary (Signed)
Patient ID: Alex Hunter MRN: [id number] DOB/AGE: 66-22-1957 66 y.o.  Admit date: 04/21/2023 Discharge date: 04/22/2023  Admission Diagnoses:  Principal Problem:   Status post total replacement of left hip Active Problems:   Unilateral primary osteoarthritis, left hip   Discharge Diagnoses:  Same  Past Medical History:  Diagnosis Date   Anxiety    Arthritis    "right hip" (01/09/2017)   Asthma    DM (diabetes mellitus) (HCC)    High blood pressure    High cholesterol    History of kidney stones     Surgeries: Procedure(s): LEFT TOTAL HIP ARTHROPLASTY ANTERIOR APPROACH on 04/21/2023   Consultants:   Discharged Condition: Improved  Hospital Course: Alex Hunter is an 67 y.o. male who was admitted 04/21/2023 for operative treatment ofStatus post total replacement of left hip. Patient has severe unremitting pain that affects sleep, daily activities, and work/hobbies. After pre-op clearance the patient was taken to the operating room on 04/21/2023 and underwent  Procedure(s): LEFT TOTAL HIP ARTHROPLASTY ANTERIOR APPROACH.    Patient was given perioperative antibiotics:  Anti-infectives (From admission, onward)    Start     Dose/Rate Route Frequency Ordered Stop   04/21/23 1430  ceFAZolin (ANCEF) IVPB 2g/100 mL premix        2 g 200 mL/hr over 30 Minutes Intravenous Every 6 hours 04/21/23 1233 04/22/23 0738   04/21/23 0630  ceFAZolin (ANCEF) IVPB 2g/100 mL premix        2 g 200 mL/hr over 30 Minutes Intravenous On call to O.R. 04/21/23 0865 04/21/23 0841        Patient was given sequential compression devices, early ambulation, and chemoprophylaxis to prevent DVT.  Patient benefited maximally from hospital stay and there were no complications.    Recent vital signs: Patient Vitals for the past 24 hrs:  BP Temp Temp src Pulse Resp SpO2  04/22/23 1008 (!) 131/91 97.9 F (36.6 C) Oral 96 18 95 %  04/22/23 0656 (!) 159/87 97.9 F (36.6 C) Oral 93 18 97 %   04/22/23 0233 (!) 154/81 98.4 F (36.9 C) Oral 96 16 97 %  04/21/23 2250 132/76 98.6 F (37 C) -- (!) 102 16 93 %  04/21/23 1456 136/76 (!) 97.5 F (36.4 C) Oral 72 18 100 %  04/21/23 1241 123/82 97.6 F (36.4 C) Oral 61 18 98 %  04/21/23 1200 134/82 -- -- 63 12 99 %  04/21/23 1145 130/85 -- -- (!) 58 14 100 %  04/21/23 1130 132/83 -- -- 60 12 100 %     Recent laboratory studies:  Recent Labs    04/22/23 0334  WBC 13.5*  HGB 13.3  HCT 41.3  PLT 242  NA 137  K 4.4  CL 103  CO2 25  BUN 19  CREATININE 1.09  GLUCOSE 169*  CALCIUM 8.8*     Discharge Medications:   Allergies as of 04/22/2023   No Known Allergies      Medication List     STOP taking these medications    aspirin EC 81 MG tablet Replaced by: aspirin 81 MG chewable tablet       TAKE these medications    aspirin 81 MG chewable tablet Chew 1 tablet (81 mg total) by mouth 2 (two) times daily. Replaces: aspirin EC 81 MG tablet   ezetimibe 10 MG tablet Commonly known as: ZETIA Take 10 mg by mouth in the morning.   losartan 100 MG tablet Commonly known as: COZAAR  Take 100 mg by mouth in the morning.   meloxicam 15 MG tablet Commonly known as: MOBIC Take 15 mg by mouth in the morning.   metFORMIN 500 MG tablet Commonly known as: GLUCOPHAGE Take 500 mg by mouth in the morning.   ondansetron 4 MG disintegrating tablet Commonly known as: ZOFRAN-ODT Take 1 tablet (4 mg total) by mouth every 8 (eight) hours as needed for nausea or vomiting.   oxyCODONE 5 MG immediate release tablet Commonly known as: Oxy IR/ROXICODONE Take 1-2 tablets (5-10 mg total) by mouth every 6 (six) hours as needed for moderate pain (pain score 4-6) (pain score 4-6). No more than 6 tablets daily.   tiZANidine 4 MG tablet Commonly known as: ZANAFLEX Take 1 tablet (4 mg total) by mouth every 6 (six) hours as needed for muscle spasms.               Durable Medical Equipment  (From admission, onward)            Start     Ordered   04/21/23 1234  DME 3 n 1  Once        04/21/23 1233   04/21/23 1234  DME Walker rolling  Once       Question Answer Comment  Walker: With 5 Inch Wheels   Patient needs a walker to treat with the following condition Status post total replacement of left hip      04/21/23 1233            Diagnostic Studies: DG Pelvis Portable Result Date: 04/21/2023 CLINICAL DATA:  Status post hip replacement EXAM: PORTABLE PELVIS 1 VIEWS COMPARISON:  X-ray 01/09/2017. FINDINGS: Portable limited view of the pelvis excludes the upper portion of the pelvis. Interval changes of the left hip arthroplasty with Press-Fit femoral component and screw fixated acetabular cup. Lateral skin staples and soft tissue gas. Expected alignment. Previous right hemiarthroplasty appears stable on this single portable view. Preserved bone mineralization. Imaging was obtained to aid in treatment. IMPRESSION: Acute surgical changes of left total hip arthroplasty. Electronically Signed   By: Karen Kays M.D.   On: 04/21/2023 14:28   DG HIP UNILAT WITH PELVIS 1V LEFT Result Date: 04/21/2023 CLINICAL DATA:  Total left hip arthroplasty. Intraoperative fluoroscopy. EXAM: DG HIP (WITH OR WITHOUT PELVIS) 1V*L* COMPARISON:  Pelvis and left hip radiographs 02/27/2023 FINDINGS: Images were performed intraoperatively without the presence of a radiologist. Interval total left hip arthroplasty. Redemonstration of partially visualized total right hip arthroplasty. No hardware complication is seen. Total fluoroscopy images: 3 Total fluoroscopy time: 36 seconds Total dose: Radiation Exposure Index (as provided by the fluoroscopic device): 5.6 mGy air Kerma Please see intraoperative findings for further detail. IMPRESSION: Intraoperative fluoroscopy for total left hip arthroplasty. Electronically Signed   By: Neita Garnet M.D.   On: 04/21/2023 12:18   DG C-Arm 1-60 Min-No Report Result Date: 04/21/2023 Fluoroscopy  was utilized by the requesting physician.  No radiographic interpretation.   DG C-Arm 1-60 Min-No Report Result Date: 04/21/2023 Fluoroscopy was utilized by the requesting physician.  No radiographic interpretation.    Disposition: Discharge disposition: 01-Home or Self Care          Follow-up Information     Kathryne Hitch, MD Follow up in 2 week(s).   Specialty: Orthopedic Surgery Contact information: [address] [place] [address] [phone]                  Signed: Kathryne Hitch 04/22/2023,  11:22 AM

## 2023-04-22 NOTE — Progress Notes (Signed)
Subjective: 1 Day Post-Op Procedure(s) (LRB): LEFT TOTAL HIP ARTHROPLASTY ANTERIOR APPROACH (Left) Patient reports pain as moderate.    Objective: Vital signs in last 24 hours: Temp:  [97.5 F (36.4 C)-98.6 F (37 C)] 97.9 F (36.6 C) (12/21 1008) Pulse Rate:  [58-102] 96 (12/21 1008) Resp:  [12-18] 18 (12/21 1008) BP: (123-159)/(76-91) 131/91 (12/21 1008) SpO2:  [93 %-100 %] 95 % (12/21 1008)  Intake/Output from previous day: 12/20 0701 - 12/21 0700 In: 1220 [P.O.:720; I.V.:300; IV Piggyback:200] Out: 1825 [Urine:1725; Blood:100] Intake/Output this shift: Total I/O In: 120 [P.O.:120] Out: 180 [Urine:180]  Recent Labs    04/22/23 0334  HGB 13.3   Recent Labs    04/22/23 0334  WBC 13.5*  RBC 4.81  HCT 41.3  PLT 242   Recent Labs    04/22/23 0334  NA 137  K 4.4  CL 103  CO2 25  BUN 19  CREATININE 1.09  GLUCOSE 169*  CALCIUM 8.8*   No results for input(s): "LABPT", "INR" in the last 72 hours.  Sensation intact distally Intact pulses distally Dorsiflexion/Plantar flexion intact Incision: dressing C/D/I   Assessment/Plan: 1 Day Post-Op Procedure(s) (LRB): LEFT TOTAL HIP ARTHROPLASTY ANTERIOR APPROACH (Left) Up with therapy Discharge home with home health      Kathryne Hitch 04/22/2023, 11:20 AM

## 2023-04-24 ENCOUNTER — Encounter (HOSPITAL_COMMUNITY): Payer: Self-pay | Admitting: Orthopaedic Surgery

## 2023-05-04 ENCOUNTER — Encounter: Payer: Self-pay | Admitting: Orthopaedic Surgery

## 2023-05-04 ENCOUNTER — Ambulatory Visit: Payer: Medicare HMO | Admitting: Orthopaedic Surgery

## 2023-05-04 DIAGNOSIS — Z96642 Presence of left artificial hip joint: Secondary | ICD-10-CM

## 2023-05-04 MED ORDER — OXYCODONE HCL 5 MG PO TABS: 5.0000 mg | ORAL_TABLET | Freq: Four times a day (QID) | ORAL | 0 refills | Status: DC | PRN

## 2023-05-04 MED ORDER — TIZANIDINE HCL 4 MG PO TABS: 4.0000 mg | ORAL_TABLET | Freq: Four times a day (QID) | ORAL | 1 refills | Status: DC | PRN

## 2023-05-04 NOTE — Progress Notes (Signed)
 The patient is a 68 year old gentleman who is here for his first postoperative visit status post a left total hip arthroplasty.  He said he is doing great.  He feels like his leg lengths are equal and he is very pleased overall.  He wants to transition from a walker to a cane.  We needed to give him a cane today.  His left hip incision looks great.  His calf is soft.  He will go back to his once a day aspirin  that he was on prior to surgery.  He can try some Benadryl  or melatonin to help him sleep at night.  I did refill his oxycodone  and Zanaflex .  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  No x-rays are needed.  All questions and concerns were addressed and answered.

## 2023-05-11 ENCOUNTER — Telehealth: Payer: Self-pay | Admitting: Orthopaedic Surgery

## 2023-05-11 NOTE — Telephone Encounter (Signed)
 Patient called and had some questions to ask about UBO codes as far as the hip replacement. CB#[phone]

## 2023-05-11 NOTE — Telephone Encounter (Signed)
 Pt walked in requesting paperwork information on his last surgery for insurance. Pt is requesting a phone call at [phone]

## 2023-05-15 NOTE — Telephone Encounter (Signed)
 IC patient. Advised pt to bring in paperwork and $ 25 form fee. Patient expressed understanding.

## 2023-05-23 ENCOUNTER — Telehealth: Payer: Self-pay | Admitting: Orthopaedic Surgery

## 2023-05-23 NOTE — Telephone Encounter (Signed)
Op note printed for patient to pickup

## 2023-06-05 ENCOUNTER — Encounter: Payer: Self-pay | Admitting: Orthopaedic Surgery

## 2023-06-05 ENCOUNTER — Ambulatory Visit: Payer: Medicare HMO | Admitting: Orthopaedic Surgery

## 2023-06-05 DIAGNOSIS — Z96642 Presence of left artificial hip joint: Secondary | ICD-10-CM

## 2023-06-05 NOTE — Progress Notes (Signed)
The patient is here today in postoperative follow-up at 6 weeks status post a left total hip replacement.  He is an active 68 year old gentleman.  He reports he is doing very well.  One of my colleagues in town replaced his right hip.  There was a significant leg length difference between his right and left sides and now he has equal after we replaced his left hip.  He walks with normal gait as well.  He reports just a little stiffness but overall he is doing well.  He says he can go back to regular work duties tomorrow but does not need a note.  His right and left hip moves smoothly.  His leg lengths appear normal and there is no limp in his gait.  From my standpoint I do not need to see him back for 6 months unless there are issues.  Will have a standing low AP pelvis and a lateral of his more recent left hip at that visit.

## 2023-06-27 ENCOUNTER — Telehealth: Payer: Self-pay | Admitting: Radiology

## 2023-06-27 NOTE — Telephone Encounter (Signed)
 Patient said they just rescheduled him and by that point he will be more than 3 months out from surgery and will not need antibiotics at that point

## 2023-06-27 NOTE — Telephone Encounter (Signed)
 Patient called stating he couldn't get his teeth cleaned because his dentist was questioning if he needed antibiotics since having left THA on 04/21/23. Please advise [phone]

## 2023-07-06 DIAGNOSIS — N529 Male erectile dysfunction, unspecified: Secondary | ICD-10-CM | POA: Diagnosis not present

## 2023-07-06 DIAGNOSIS — M169 Osteoarthritis of hip, unspecified: Secondary | ICD-10-CM | POA: Diagnosis not present

## 2023-07-06 DIAGNOSIS — I10 Essential (primary) hypertension: Secondary | ICD-10-CM | POA: Diagnosis not present

## 2023-07-06 DIAGNOSIS — I700 Atherosclerosis of aorta: Secondary | ICD-10-CM | POA: Diagnosis not present

## 2023-07-06 DIAGNOSIS — E1159 Type 2 diabetes mellitus with other circulatory complications: Secondary | ICD-10-CM | POA: Diagnosis not present

## 2023-07-06 DIAGNOSIS — E669 Obesity, unspecified: Secondary | ICD-10-CM | POA: Diagnosis not present

## 2023-07-06 DIAGNOSIS — E785 Hyperlipidemia, unspecified: Secondary | ICD-10-CM | POA: Diagnosis not present

## 2023-07-06 DIAGNOSIS — Z6832 Body mass index (BMI) 32.0-32.9, adult: Secondary | ICD-10-CM | POA: Diagnosis not present

## 2023-07-14 ENCOUNTER — Other Ambulatory Visit: Payer: Self-pay | Admitting: Radiology

## 2023-07-14 ENCOUNTER — Telehealth: Payer: Self-pay | Admitting: Orthopaedic Surgery

## 2023-07-14 MED ORDER — TIZANIDINE HCL 4 MG PO TABS: 4.0000 mg | ORAL_TABLET | Freq: Four times a day (QID) | ORAL | 1 refills | Status: DC | PRN

## 2023-07-14 NOTE — Telephone Encounter (Signed)
 sent

## 2023-07-14 NOTE — Telephone Encounter (Signed)
 Pt requesting a phone call in regards to questions he has about his lower back "locking up" when he moves a certain way. This pt is po Dec 20/24

## 2023-07-26 ENCOUNTER — Ambulatory Visit: Admitting: Physician Assistant

## 2023-08-07 ENCOUNTER — Ambulatory Visit: Admitting: Physician Assistant

## 2023-08-07 ENCOUNTER — Other Ambulatory Visit (INDEPENDENT_AMBULATORY_CARE_PROVIDER_SITE_OTHER): Payer: Self-pay

## 2023-08-07 DIAGNOSIS — M79671 Pain in right foot: Secondary | ICD-10-CM

## 2023-08-07 DIAGNOSIS — M545 Low back pain, unspecified: Secondary | ICD-10-CM

## 2023-08-07 NOTE — Progress Notes (Signed)
 Office Visit Note   Patient: Alex Hunter           Date of Birth: [birth year]           MRN: [id number] Visit Date: 08/07/2023              Requested by: Melida Quitter, MD [address] [place],  [place] [address] PCP: Melida Quitter, MD   Assessment & Plan: Visit Diagnoses:  1. Midline low back pain without sciatica, unspecified chronicity   2. Pain in right foot     Plan: Will refer him back to Dr. Shon Baton for possible shockwave therapy to the right heel Achilles region.  Will send in formal physical therapy for his back to work on core strengthening, stretching including modalities and home exercise program.  Follow-up with [place] in 4 weeks to see how he is improving overall.  Questions were encouraged and answered at length.  Follow-Up Instructions: No follow-ups on file.   Orders:  Orders Placed This Encounter  Procedures   XR Lumbar Spine 2-3 Views   XR Foot Complete Right   No orders of the defined types were placed in this encounter.     Procedures: No procedures performed   Clinical Data: No additional findings.   Subjective: Chief Complaint  Patient presents with   Lower Back - Pain   Right Foot - Pain    HPI Patient is 68 year old male known to Dr. Beaulah Dinning.  History of left total hip arthroplasty 04/21/2023.  Left hip overall doing well.  Also history of right total hip arthroplasty 2018 by Dr. Linna Caprice.  Patient with complaint of a back "locking up".  He notes that he has the sensation of his back locking up after driving for long periods of time.  He does drive for living.  He denies any numbness tingling down either leg.  Denies any radicular symptoms.  Denies any waking pain or saddle anesthesia.  Denies fever or chills.  Has had unintentional weight loss 18 to 19 pounds after the last total hip thought this was completely normal.  He also has been seen in the past for Achilles insertional tendinitis by Dr. Shon Baton has been wearing heel lifts states that  the right heel has been bothering him pain is 10 out of 10 pain at worst.  No acute injury.  Review of Systems   Objective: Vital Signs: There were no vitals taken for this visit.  Physical Exam Constitutional:      Appearance: He is not ill-appearing or diaphoretic.  Cardiovascular:     Pulses: Normal pulses.  Pulmonary:     Effort: Pulmonary effort is normal.  Neurological:     Mental Status: He is alert and oriented to person, place, and time.  Psychiatric:        Mood and Affect: Mood normal.     Ortho Exam Lower extremities: Exquisitely tight hamstrings and gastrocs bilaterally.  2+ equal symmetric.  Sensation intact bilateral feet to light touch.  Negative straight leg raise bilaterally.  5 out of 5 strength throughout the lower extremities good range of motion bilateral hip without significant pain.  He is unable to touch his toes coming within 4 inches.  He has limited extension also of the lumbar spine. Right Achilles: Nontender mid Achilles.  Tenderness at the insertion of the Achilles.  There is no skin breakdown no abnormal warmth or erythema.   Specialty Comments:  No specialty comments available.  Imaging: XR Lumbar Spine 2-3  Views Result Date: 08/07/2023 Lumbar spine 2 views: Status post bilateral total hip arthroplasties.  No acute fractures.  Displays overall well-maintained.  Lower lumbar facet arthritic changes.  No spondylolisthesis.  XR Foot Complete Right Result Date: 08/07/2023 Right foot 3 views: No acute fractures or dislocations.  Haglund's deformity present.  Morton's type foot the second third metatarsal is larger than first.    PMFS History: Patient Active Problem List   Diagnosis Date Noted   Status post total replacement of left hip 04/21/2023   Trochanteric bursitis of left hip 06/03/2017   Essential hypertension 03/06/2012   Perirectal abscess 03/06/2012   Hyperlipidemia 11/19/2009   Past Medical History:  Diagnosis Date   Anxiety     Arthritis    "right hip" (01/09/2017)   Asthma    DM (diabetes mellitus) (HCC)    High blood pressure    High cholesterol    History of kidney stones     Family History  Problem Relation Age of Onset   Heart disease Mother    Leukemia Brother    Heart attack Brother    Colon cancer Neg Hx    Colon polyps Neg Hx    Esophageal cancer Neg Hx    Rectal cancer Neg Hx    Stomach cancer Neg Hx     Past Surgical History:  Procedure Laterality Date   COLONOSCOPY  06/21/2022   COLONOSCOPY  09/03/2009   F/V, Melvia Heaps, excellent prep, normal   DENTAL SURGERY     "had an implant" (01/09/2017)   TOTAL HIP ARTHROPLASTY Right 01/09/2017   TOTAL HIP ARTHROPLASTY Right 01/09/2017   Procedure: RIGHT TOTAL HIP ARTHROPLASTY ANTERIOR APPROACH;  Surgeon: Samson Frederic, MD;  Location: [facility];  Service: Orthopedics;  Laterality: Right;   TOTAL HIP ARTHROPLASTY Left 04/21/2023   Procedure: LEFT TOTAL HIP ARTHROPLASTY ANTERIOR APPROACH;  Surgeon: Kathryne Hitch, MD;  Location: [facility];  Service: Orthopedics;  Laterality: Left;   Social History   Occupational History   Not on file  Tobacco Use   Smoking status: Former    Current packs/day: 0.00    Average packs/day: 0.5 packs/day for 10.0 years (5.0 ttl pk-yrs)    Types: Cigarettes    Start date: 72    Quit date: 1989    Years since quitting: 36.2   Smokeless tobacco: Never   Tobacco comments:    "quit smoking in the 1980s"  Vaping Use   Vaping status: Never Used  Substance and Sexual Activity   Alcohol use: Not Currently    Comment: once every year   Drug use: Not Currently    Types: Cocaine    Comment: "went thru treatment back in the 1980s"   Sexual activity: Yes

## 2023-08-07 NOTE — Addendum Note (Signed)
 Addended by: Mardene Celeste B on: 08/07/2023 01:43 PM   Modules accepted: Orders

## 2023-08-09 ENCOUNTER — Ambulatory Visit: Admitting: Physical Therapy

## 2023-08-10 ENCOUNTER — Ambulatory Visit: Admitting: Physical Therapy

## 2023-08-16 ENCOUNTER — Encounter: Payer: Self-pay | Admitting: Rehabilitative and Restorative Service Providers"

## 2023-08-16 ENCOUNTER — Ambulatory Visit (INDEPENDENT_AMBULATORY_CARE_PROVIDER_SITE_OTHER): Admitting: Sports Medicine

## 2023-08-16 ENCOUNTER — Encounter: Payer: Self-pay | Admitting: Sports Medicine

## 2023-08-16 ENCOUNTER — Ambulatory Visit (INDEPENDENT_AMBULATORY_CARE_PROVIDER_SITE_OTHER): Admitting: Rehabilitative and Restorative Service Providers"

## 2023-08-16 DIAGNOSIS — R262 Difficulty in walking, not elsewhere classified: Secondary | ICD-10-CM

## 2023-08-16 DIAGNOSIS — R293 Abnormal posture: Secondary | ICD-10-CM

## 2023-08-16 DIAGNOSIS — M5459 Other low back pain: Secondary | ICD-10-CM

## 2023-08-16 DIAGNOSIS — M25571 Pain in right ankle and joints of right foot: Secondary | ICD-10-CM | POA: Diagnosis not present

## 2023-08-16 DIAGNOSIS — M9261 Juvenile osteochondrosis of tarsus, right ankle: Secondary | ICD-10-CM | POA: Diagnosis not present

## 2023-08-16 DIAGNOSIS — M7661 Achilles tendinitis, right leg: Secondary | ICD-10-CM | POA: Diagnosis not present

## 2023-08-16 MED ORDER — METHYLPREDNISOLONE 4 MG PO TBPK: ORAL_TABLET | ORAL | 0 refills | Status: AC

## 2023-08-16 NOTE — Progress Notes (Signed)
 Patient says that he has been having heel pain for about a year now. He says that it is always sore, but his flare ups of pain seem to come and go. He says that his shoes seem to increase his pain. He does not have much trouble beyond soreness in the mornings, but says that driving for awhile he will have pain and stiffness when he stands up out of the car. He has not taken any medication specifically for the heel, and denies using ice or heat. He says that his pain is primarily in the back of the heel but does sometimes wrap around to the bottom.

## 2023-08-16 NOTE — Progress Notes (Signed)
 Alex Hunter - 68 y.o. male MRN [id number]  Date of birth: 11/10/1955  Office Visit Note: Visit Date: 08/16/2023 PCP: Melida Quitter, MD Referred by: Melida Quitter, MD  Subjective: Chief Complaint  Patient presents with   Right Heel - Pain   HPI: Alex Hunter is a pleasant 68 y.o. male who presents today for acute on chronic right heel pain.  We have seen him in the past more for the Achilles as well as the plantar fascia.  He does have a known Haglund deformity.  His pain has been bothering him over the last year or more, but here over the last week or 2 has felt exacerbated.  He points to the superior aspect of the calcaneus.  He had used a heel cup in the past but states this is no longer helpful.  He does have his home exercises for the Achilles which he still does at times.  Pertinent ROS were reviewed with the patient and found to be negative unless otherwise specified above in HPI.   Assessment & Plan: Visit Diagnoses:  1. Haglund deformity of right heel   2. Achilles tendinitis, right leg    Plan: Impression is chronic right heel pain with acute exacerbation.  This is twofold in nature with a known Haglund deformity, calcaneal spurring with enthesophytes at the distal Achilles insertion with a degree of tendinitis.  He has received benefit from shockwave in the past with other pathology, we did proceed with a treatment of ESWT today.  Patient states he felt well following this procedure.  He will resume his Achilles and posterior chain home exercises 3 times weekly.  He has been doing warm Epsom salt bath and may use this or Voltaren gel topically.  I will add a Medrol Dosepak for him to take for acute flares.  I would like to see him back in 1 week for repeat shockwave treatment and further evaluation.  Follow-up: Return in about 1 week (around 08/23/2023) for R-heel/achilles.   Meds & Orders:  Meds ordered this encounter  Medications   methylPREDNISolone (MEDROL  DOSEPAK) 4 MG TBPK tablet    Sig: Take per packet instruction. Taper dosing.    Dispense:  1 each    Refill:  0    Pick-up when needed   No orders of the defined types were placed in this encounter.    Procedures: Procedure: ECSWT Indications:  Heel spur, Achilles tendinitis   Procedure Details Consent: Risks of procedure as well as the alternatives and risks of each were explained to the patient.  Verbal consent for procedure obtained. Time Out: Verified patient identification, verified procedure, site was marked, verified correct patient position. The area was cleaned with alcohol swab.     The Right calcaneus and distal achilles was targeted for Extracorporeal shockwave therapy.    Preset: Heel spur Power Level: 90 mJ Frequency: 10 Impulse/cycles: 2100 Head size: Regular   Patient tolerated procedure well without immediate complications.       Clinical History: No specialty comments available.  He reports that he quit smoking about 36 years ago. His smoking use included cigarettes. He started smoking about 46 years ago. He has a 5 pack-year smoking history. He has never used smokeless tobacco.  Recent Labs    04/14/23 0830  HGBA1C 6.3*    Objective:    Physical Exam  Gen: Well-appearing, in no acute distress; non-toxic CV: Well-perfused. Warm.  Resp: Breathing unlabored on room air; no wheezing.  Psych: Fluid speech in conversation; appropriate affect; normal thought process  Ortho Exam - Right foot/ankle: There is no redness swelling or effusion of the ankle.  There is positive TTP over the superior calcaneus right at the distal insertion of the Achilles.  There is no nodularity of the Achilles.  Very mild Achilles contracture with dorsiflexion, full range plantarflexion.   Imaging:  - 08/07/23 --> independent review and interpretation of three-view right foot x-ray was performed by myself today.  X-rays demonstrate notable calcaneal spurring with enthesophytes at  the distal Achilles insertion and moderate plantar calcaneal spur.  There is a small Haglund deformity noted as well.  XR Lumbar Spine 2-3 Views Lumbar spine 2 views: Status post bilateral total hip arthroplasties.  No  acute fractures.  Displays overall well-maintained.  Lower lumbar facet  arthritic changes.  No spondylolisthesis. XR Foot Complete Right Right foot 3 views: No acute fractures or dislocations.  Haglund's  deformity present.  Morton's type foot the second third metatarsal is  larger than first.    Past Medical/Family/Surgical/Social History: Medications & Allergies reviewed per EMR, new medications updated. Patient Active Problem List   Diagnosis Date Noted   Status post total replacement of left hip 04/21/2023   Trochanteric bursitis of left hip 06/03/2017   Essential hypertension 03/06/2012   Perirectal abscess 03/06/2012   Hyperlipidemia 11/19/2009   Past Medical History:  Diagnosis Date   Anxiety    Arthritis    "right hip" (01/09/2017)   Asthma    DM (diabetes mellitus) (HCC)    High blood pressure    High cholesterol    History of kidney stones    Family History  Problem Relation Age of Onset   Heart disease Mother    Leukemia Brother    Heart attack Brother    Colon cancer Neg Hx    Colon polyps Neg Hx    Esophageal cancer Neg Hx    Rectal cancer Neg Hx    Stomach cancer Neg Hx    Past Surgical History:  Procedure Laterality Date   COLONOSCOPY  06/21/2022   COLONOSCOPY  09/03/2009   F/V, Melvia Heaps, excellent prep, normal   DENTAL SURGERY     "had an implant" (01/09/2017)   TOTAL HIP ARTHROPLASTY Right 01/09/2017   TOTAL HIP ARTHROPLASTY Right 01/09/2017   Procedure: RIGHT TOTAL HIP ARTHROPLASTY ANTERIOR APPROACH;  Surgeon: Samson Frederic, MD;  Location: [facility];  Service: Orthopedics;  Laterality: Right;   TOTAL HIP ARTHROPLASTY Left 04/21/2023   Procedure: LEFT TOTAL HIP ARTHROPLASTY ANTERIOR APPROACH;  Surgeon: Kathryne Hitch,  MD;  Location: [facility];  Service: Orthopedics;  Laterality: Left;   Social History   Occupational History   Not on file  Tobacco Use   Smoking status: Former    Current packs/day: 0.00    Average packs/day: 0.5 packs/day for 10.0 years (5.0 ttl pk-yrs)    Types: Cigarettes    Start date: 78    Quit date: 1989    Years since quitting: 36.3   Smokeless tobacco: Never   Tobacco comments:    "quit smoking in the 1980s"  Vaping Use   Vaping status: Never Used  Substance and Sexual Activity   Alcohol use: Not Currently    Comment: once every year   Drug use: Not Currently    Types: Cocaine    Comment: "went thru treatment back in the 1980s"   Sexual activity: Yes

## 2023-08-16 NOTE — Therapy (Addendum)
 OUTPATIENT PHYSICAL THERAPY LUMBAR & LOWER EXTREMITY EVALUATION Referring diagnosis? M54.50 (ICD-10-CM) - Midline low back pain without sciatica, unspecified chronicity M79.671 (ICD-10-CM) - Pain in right foot  Treatment diagnosis? (if different than referring diagnosis) R29.3   M54.59   M25.571   R26.2 What was this (referring dx) caused by? []  Surgery []  Fall []  Ongoing issue [x]  Arthritis []  Other: ____________  Laterality: []  Rt []  Lt [x]  Both  Check all possible CPT codes:  *CHOOSE 10 OR LESS*    See Planned Interventions listed in the Plan section of the Evaluation.    Patient Name: JAMUS LOVING MRN: [id number] DOB:31-Oct-1955, 68 y.o., male Today's Date: 08/16/2023  END OF SESSION:  PT End of Session - 08/16/23 1028     Visit Number 1    Number of Visits 10    Date for PT Re-Evaluation 10/11/23    Authorization Type [employer]    Progress Note Due on Visit 10    PT Start Time 0934    PT Stop Time 1016    PT Time Calculation (min) 42 min    Activity Tolerance Patient tolerated treatment well;No increased pain    Behavior During Therapy [facility] for tasks assessed/performed             Past Medical History:  Diagnosis Date   Anxiety    Arthritis    "right hip" (01/09/2017)   Asthma    DM (diabetes mellitus) (HCC)    High blood pressure    High cholesterol    History of kidney stones    Past Surgical History:  Procedure Laterality Date   COLONOSCOPY  06/21/2022   COLONOSCOPY  09/03/2009   F/V, Melvia Heaps, excellent prep, normal   DENTAL SURGERY     "had an implant" (01/09/2017)   TOTAL HIP ARTHROPLASTY Right 01/09/2017   TOTAL HIP ARTHROPLASTY Right 01/09/2017   Procedure: RIGHT TOTAL HIP ARTHROPLASTY ANTERIOR APPROACH;  Surgeon: Samson Frederic, MD;  Location: [facility];  Service: Orthopedics;  Laterality: Right;   TOTAL HIP ARTHROPLASTY Left 04/21/2023   Procedure: LEFT TOTAL HIP ARTHROPLASTY ANTERIOR APPROACH;  Surgeon: Kathryne Hitch, MD;   Location: [facility];  Service: Orthopedics;  Laterality: Left;   Patient Active Problem List   Diagnosis Date Noted   Status post total replacement of left hip 04/21/2023   Trochanteric bursitis of left hip 06/03/2017   Essential hypertension 03/06/2012   Perirectal abscess 03/06/2012   Hyperlipidemia 11/19/2009    PCP: Melida Quitter, MD  REFERRING PROVIDER: Kirtland Bouchard, PA-C  REFERRING DIAG: M54.50 (ICD-10-CM) - Midline low back pain without sciatica, unspecified chronicity M79.671 (ICD-10-CM) - Pain in right foot  THERAPY DIAG:  Abnormal posture  Other low back pain  Pain in right ankle and joints of right foot  Difficulty in walking, not elsewhere classified  Rationale for Evaluation and Treatment: Rehabilitation  ONSET DATE: After his second hip replacement in December 2024  SUBJECTIVE:   SUBJECTIVE STATEMENT: Drome notes he had his right hip replaced in 12/2016 and left hip replaced in 04/2023.  Low back pain has been present since his left hip replacement.  He notes stiffness when getting out of his truck and with prolonged postures.  PERTINENT HISTORY: DM, Asthma, kidney stones, bilateral total hip replacements   PAIN:  Are you having pain? Yes: NPRS scale: 0-5/10 this week Pain location: Belt line Pain description: Stiffness Aggravating factors: Tying shoes, putting on socks and shoes, stiff when getting out of truck after 15-20  minutes Relieving factors: Change of position, give it a second between change of positions  PRECAUTIONS: Back  RED FLAGS: None   WEIGHT BEARING RESTRICTIONS: No  FALLS:  Has patient fallen in last 6 months? No  LIVING ENVIRONMENT: Lives with: lives with their family Lives in: House/apartment Stairs:  Some difficulty after the recent THA Has following equipment at home: None  OCCUPATION: Landscape architect  PLOF: Independent  PATIENT GOALS: Be able to function normally, less pain  NEXT MD VISIT: 08/23/2023 with Dr.  Vaughn Georges "  OBJECTIVE:  Note: Objective measures were completed at Evaluation unless otherwise noted.  DIAGNOSTIC FINDINGS: Lumbar spine 2 views: Status post bilateral total hip arthroplasties.  No  acute fractures.  Displays overall well-maintained.  Lower lumbar facet  arthritic changes.  No spondylolisthesis.  PATIENT SURVEYS:  Patient-Specific Activity Scoring Scheme  "0" represents "unable to perform." "10" represents "able to perform at prior level. 0 1 2 3 4 5 6 7 8 9  10 (Date and Score)   Activity Eval     1.  Put on socks and shoes  3    2.  Cut toenails  0    3.  Walk when getting out of the truck 4   4.    5.    Score 7/30    Total score = sum of the activity scores/number of activities Minimum detectable change (90%CI) for average score = 2 points Minimum detectable change (90%CI) for single activity score = 3 points     COGNITION: Overall cognitive status: Within functional limits for tasks assessed     SENSATION: WFL  EDEMA:  None noted  MUSCLE LENGTH: Hamstrings: Right 30 deg; Left 25 deg  POSTURE:  Jettie has a good standing posture, but he is very stiff with all movement  AROM:  Passive ROM Left/Right 08/16/2023   Hip flexion 70/70   Hip extension    Hip abduction    Hip adduction    Hip internal rotation    Hip external rotation    Knee flexion    Knee extension    Ankle dorsiflexion Next visit   Ankle plantarflexion    Ankle inversion    Ankle eversion    Lumbar extension  5    (Blank rows = not tested)  STRENGTH:  MMT Left/Right 08/16/2023   Hip flexion    Hip extension    Hip abduction    Hip adduction    Hip internal rotation    Hip external rotation    Knee flexion    Knee extension    Ankle dorsiflexion    Ankle plantarflexion    Ankle inversion    Ankle eversion    Lumbar extension Unable to assume test postures at evaluation    (Blank rows = not tested)  GAIT: Distance walked: 50 feet Assistive device utilized:  None Level of assistance: Complete Independence Comments: Benjamyn has a stiff gait  TREATMENT DATE:  08/16/2023 Lumbar extension AROM, hips forward with hands on gluteals 10 x 3 seconds Single knee-to-chest stretch with other leg straight 4 x 20 seconds bilateral Supine hamstrings stretch with other leg straight 4 x 20 seconds bilateral  [id number]: Reviewed spine anatomy with the spine model, reviewed imaging, discussed the importance of frequent changes of position, golfers and diagonal squat lift, reviewed exam findings, day 1 home exercise program and prescribed walking 3 times a week for 20 minutes or more per episode   PATIENT EDUCATION:  Education details: See above Person educated: Patient Education method: Explanation, Demonstration, Tactile cues, Verbal cues, and Handouts Education comprehension: verbalized understanding, returned demonstration, verbal cues required, tactile cues required, and needs further education  HOME EXERCISE PROGRAM: Access Code: [email or web address] URL: [email or web address] Date: 08/16/2023 Prepared by: Terral Ferrari  Exercises - Standing Lumbar Extension at Wall - Forearms  - 5 x daily - 7 x weekly - 1 sets - 5 reps - 3 seconds hold - Single Knee to Chest Stretch  - 2-3 x daily - 7 x weekly - 1 sets - 5 reps - 20 seconds hold - Supine Hamstring Stretch  - 2-3 x daily - 7 x weekly - 1 sets - 5 reps - 20 seconds hold  ASSESSMENT:  CLINICAL IMPRESSION: Patient is a 68 y.o. male who was seen today for physical therapy evaluation and treatment for M54.50 (ICD-10-CM) - Midline low back pain without sciatica, unspecified chronicity M79.671 (ICD-10-CM) - Pain in right foot.  Examination today was focused on the low back as this was Burle's biggest concern today.  We will assess right ankle active range of motion, flexibility and  strength as from our conversation it appears he may have an Achilles tendinitis.  We spent quite a bit of time on education and reviewed his day 1 home exercise program which is addressing impairments noted today.  Active range of motion flexibility will be a major focus of Zakry's early rehabilitation.  Additional hip active range of motion and spine strength assessment will be completed as required based on progress with his daily activities.  His prognosis to meet the below listed goals remains good with the recommended plan of care.  OBJECTIVE IMPAIRMENTS: Abnormal gait, decreased endurance, decreased knowledge of condition, difficulty walking, decreased ROM, impaired perceived functional ability, impaired flexibility, improper body mechanics, postural dysfunction, and pain.   ACTIVITY LIMITATIONS: lifting, bending, sitting, standing, squatting, stairs, dressing, and locomotion level  PARTICIPATION LIMITATIONS: cleaning, community activity, and occupation  PERSONAL FACTORS: DM, Asthma, kidney stones, bilateral total hip replacements are also affecting patient's functional outcome.    REHAB POTENTIAL: Good  CLINICAL DECISION MAKING: Stable/uncomplicated  EVALUATION COMPLEXITY: Low   GOALS: Goals reviewed with patient? Yes  SHORT TERM GOALS: Target date: 09/13/2023 Tali will be independent with his day 1 home exercise program Baseline: Started 08/16/2023 Goal status: INITIAL  2.  Improve lumbar extension AROM to at least 10 degrees Baseline: 5 degrees Goal status: INITIAL  3.  Improve bilateral hip flexors flexibility to 80 degrees and hamstrings to 40 degrees Baseline: 70/70 and 25/30, respectively Goal status: INITIAL   LONG TERM GOALS: Target date: 10/11/2023  Improve patient specific functional score to at least 20/30 Baseline: 7/30 Goal status: INITIAL  2.  Davinder will report low back pain consistently 0-2/10 on the numeric pain rating score Baseline: 0-5/10 Goal  status: INITIAL  3.  Improve bilateral hip flexors flexibility to 90 degrees and hamstrings to 45 degrees Baseline: See above Goal  status: INITIAL  4.  Jermal will be independent with his long-term home maintenance exercise program at discharge Baseline: Started 08/16/2023 Goal status: INITIAL  PLAN:  PT FREQUENCY: 1-2x/week  PT DURATION: 8 weeks  PLANNED INTERVENTIONS: 97110-Therapeutic exercises, 97530- Therapeutic activity, 97112- Neuromuscular re-education, 97535- Self Care, 16109- Manual therapy, [phone]- Gait training, Patient/Family education, Stair training, Dry Needling, Spinal mobilization, and Cryotherapy  PLAN FOR NEXT SESSION: Review day 1 home exercise program.  Debra is very stiff in his low back hips, but we want to keep his home exercise program simple for compliance.  Appropriate to assess bilateral dorsiflexion active range of motion and ankle strength to address right heel pain which is most likely an Achilles tendinitis.   Joli Neas, PT, MPT 08/16/2023, 10:43 AM

## 2023-08-21 ENCOUNTER — Other Ambulatory Visit: Payer: Self-pay | Admitting: Orthopaedic Surgery

## 2023-08-23 ENCOUNTER — Ambulatory Visit (INDEPENDENT_AMBULATORY_CARE_PROVIDER_SITE_OTHER): Admitting: Sports Medicine

## 2023-08-23 ENCOUNTER — Encounter: Payer: Self-pay | Admitting: Sports Medicine

## 2023-08-23 DIAGNOSIS — M9261 Juvenile osteochondrosis of tarsus, right ankle: Secondary | ICD-10-CM | POA: Diagnosis not present

## 2023-08-23 DIAGNOSIS — M7661 Achilles tendinitis, right leg: Secondary | ICD-10-CM | POA: Diagnosis not present

## 2023-08-23 NOTE — Progress Notes (Signed)
 Patient says that he felt good for most of the week after the shockwave therapy and began feeling sore again yesterday morning. He says that he could feel the soreness gradually coming back. Because he was feeling pretty good and he was unsure about the directions for the Dosepak he has not taken any of that yet. He is stretching/doing his home exercises daily. He also mentions wearing sneakers for most of the week until he needed to wear boots again yesterday, and does think that the boots contributed to his soreness.

## 2023-08-23 NOTE — Progress Notes (Signed)
 TRAVIN MARIK - 68 y.o. male MRN [id number]  Date of birth: August 14, 1955  Office Visit Note: Visit Date: 08/23/2023 PCP: Azalia Leo, MD Referred by: Azalia Leo, MD  Subjective: Chief Complaint  Patient presents with   Right Heel - Follow-up   HPI: Alex Hunter is a pleasant 68 y.o. male who presents today for follow-up of A on C right heel pain.  Cristan had a good response to our first treatment of extracorporeal shockwave therapy, he had rather significant relief of his pain for the rest of the week, but yesterday he was working in his most of the day and his pain became slightly exacerbated.  Overall still feeling better. He has not started the Medrol  Dosepak yet but is taking a trip to [place]  beach this weekend and would like to be feeling well for that.  Pertinent ROS were reviewed with the patient and found to be negative unless otherwise specified above in HPI.   Assessment & Plan: Visit Diagnoses:  1. Haglund deformity of right heel   2. Achilles tendinitis, right leg    Plan: Impression is improving chronic right heel pain with a small Haglund and distal Achilles tendinitis with enthesophytes.  We did repeat a second treatment of ESWT today, patient tolerated well.  We discussed taking one half of the Medrol  Dosepak to help with inflammatory burden, especially in the setting of him leaving for his beach trip this weekend.  I would like him to continue his PT/home exercises.  Will see him back in 2 weeks for repeat evaluation and consideration of additional shockwave only if needed.  Follow-up: Return in about 2 weeks (around 09/06/2023) for R-heel/achilles.   Meds & Orders: No orders of the defined types were placed in this encounter.  No orders of the defined types were placed in this encounter.    Procedures: Procedure: ECSWT Indications:  Heel spur, Achilles tendinitis   Procedure Details Consent: Risks of procedure as well as the alternatives and risks  of each were explained to the patient.  Verbal consent for procedure obtained. Time Out: Verified patient identification, verified procedure, site was marked, verified correct patient position. The area was cleaned with alcohol swab.     The Right calcaneus and distal achilles was targeted for Extracorporeal shockwave therapy.    Preset: Heel spur Power Level: 100 mJ  Frequency: 12 Hz Impulse/cycles: 2400 Head size: Regular   Patient tolerated procedure well without immediate complications.      Clinical History: No specialty comments available.  He reports that he quit smoking about 36 years ago. His smoking use included cigarettes. He started smoking about 46 years ago. He has a 5 pack-year smoking history. He has never used smokeless tobacco.  Recent Labs    04/14/23 0830  HGBA1C 6.3*    Objective:     Physical Exam  Gen: Well-appearing, in no acute distress; non-toxic CV: Well-perfused. Warm.  Resp: Breathing unlabored on room air; no wheezing. Psych: Fluid speech in conversation; appropriate affect; normal thought process  Ortho Exam - Right foot/ankle: There is no redness swelling or effusion.  Very mild TTP over the superior calcaneus at the distal Achilles insertion although improved from previous visits.  Imaging: No results found.  Past Medical/Family/Surgical/Social History: Medications & Allergies reviewed per EMR, new medications updated. Patient Active Problem List   Diagnosis Date Noted   Status post total replacement of left hip 04/21/2023   Trochanteric bursitis of left hip 06/03/2017  Essential hypertension 03/06/2012   Perirectal abscess 03/06/2012   Hyperlipidemia 11/19/2009   Past Medical History:  Diagnosis Date   Anxiety    Arthritis    "right hip" (01/09/2017)   Asthma    DM (diabetes mellitus) (HCC)    High blood pressure    High cholesterol    History of kidney stones    Family History  Problem Relation Age of Onset   Heart  disease Mother    Leukemia Brother    Heart attack Brother    Colon cancer Neg Hx    Colon polyps Neg Hx    Esophageal cancer Neg Hx    Rectal cancer Neg Hx    Stomach cancer Neg Hx    Past Surgical History:  Procedure Laterality Date   COLONOSCOPY  06/21/2022   COLONOSCOPY  09/03/2009   F/V, Blenda Burdock, excellent prep, normal   DENTAL SURGERY     "had an implant" (01/09/2017)   TOTAL HIP ARTHROPLASTY Right 01/09/2017   TOTAL HIP ARTHROPLASTY Right 01/09/2017   Procedure: RIGHT TOTAL HIP ARTHROPLASTY ANTERIOR APPROACH;  Surgeon: Adonica Hoose, MD;  Location: [facility];  Service: Orthopedics;  Laterality: Right;   TOTAL HIP ARTHROPLASTY Left 04/21/2023   Procedure: LEFT TOTAL HIP ARTHROPLASTY ANTERIOR APPROACH;  Surgeon: Arnie Lao, MD;  Location: [facility];  Service: Orthopedics;  Laterality: Left;   Social History   Occupational History   Not on file  Tobacco Use   Smoking status: Former    Current packs/day: 0.00    Average packs/day: 0.5 packs/day for 10.0 years (5.0 ttl pk-yrs)    Types: Cigarettes    Start date: 77    Quit date: 1989    Years since quitting: 36.3   Smokeless tobacco: Never   Tobacco comments:    "quit smoking in the 1980s"  Vaping Use   Vaping status: Never Used  Substance and Sexual Activity   Alcohol use: Not Currently    Comment: once every year   Drug use: Not Currently    Types: Cocaine    Comment: "went thru treatment back in the 1980s"   Sexual activity: Yes

## 2023-08-24 ENCOUNTER — Ambulatory Visit (INDEPENDENT_AMBULATORY_CARE_PROVIDER_SITE_OTHER): Admitting: Physical Therapy

## 2023-08-24 ENCOUNTER — Encounter: Payer: Self-pay | Admitting: Physical Therapy

## 2023-08-24 DIAGNOSIS — R293 Abnormal posture: Secondary | ICD-10-CM | POA: Diagnosis not present

## 2023-08-24 DIAGNOSIS — M25571 Pain in right ankle and joints of right foot: Secondary | ICD-10-CM

## 2023-08-24 DIAGNOSIS — R262 Difficulty in walking, not elsewhere classified: Secondary | ICD-10-CM | POA: Diagnosis not present

## 2023-08-24 DIAGNOSIS — M5459 Other low back pain: Secondary | ICD-10-CM

## 2023-08-24 NOTE — Therapy (Signed)
 OUTPATIENT PHYSICAL THERAPY LUMBAR & LOWER EXTREMITY TREATMENT Referring diagnosis? M54.50 (ICD-10-CM) - Midline low back pain without sciatica, unspecified chronicity M79.671 (ICD-10-CM) - Pain in right foot  Treatment diagnosis? (if different than referring diagnosis) R29.3   M54.59   M25.571   R26.2 What was this (referring dx) caused by? []  Surgery []  Fall []  Ongoing issue [x]  Arthritis []  Other: ____________  Laterality: []  Rt []  Lt [x]  Both  Check all possible CPT codes:  *CHOOSE 10 OR LESS*    See Planned Interventions listed in the Plan section of the Evaluation.    Patient Name: Alex Hunter MRN: [id number] DOB:11/15/1955, 68 y.o., male Today's Date: 08/24/2023  END OF SESSION:  PT End of Session - 08/24/23 0758     Visit Number 2    Number of Visits 10    Date for PT Re-Evaluation 10/11/23    Authorization Type [employer]    Progress Note Due on Visit 10    PT Start Time 0800    PT Stop Time 0838    PT Time Calculation (min) 38 min    Activity Tolerance Patient tolerated treatment well;No increased pain    Behavior During Therapy [facility] for tasks assessed/performed             Past Medical History:  Diagnosis Date   Anxiety    Arthritis    "right hip" (01/09/2017)   Asthma    DM (diabetes mellitus) (HCC)    High blood pressure    High cholesterol    History of kidney stones    Past Surgical History:  Procedure Laterality Date   COLONOSCOPY  06/21/2022   COLONOSCOPY  09/03/2009   F/V, Alex Hunter, excellent prep, normal   DENTAL SURGERY     "had an implant" (01/09/2017)   TOTAL HIP ARTHROPLASTY Right 01/09/2017   TOTAL HIP ARTHROPLASTY Right 01/09/2017   Procedure: RIGHT TOTAL HIP ARTHROPLASTY ANTERIOR APPROACH;  Surgeon: Alex Hoose, MD;  Location: [facility];  Service: Orthopedics;  Laterality: Right;   TOTAL HIP ARTHROPLASTY Left 04/21/2023   Procedure: LEFT TOTAL HIP ARTHROPLASTY ANTERIOR APPROACH;  Surgeon: Alex Lao, MD;   Location: [facility];  Service: Orthopedics;  Laterality: Left;   Patient Active Problem List   Diagnosis Date Noted   Status post total replacement of left hip 04/21/2023   Trochanteric bursitis of left hip 06/03/2017   Essential hypertension 03/06/2012   Perirectal abscess 03/06/2012   Hyperlipidemia 11/19/2009    PCP: Alex Leo, MD  REFERRING PROVIDER: Bronson Canny, PA-C  REFERRING DIAG: M54.50 (ICD-10-CM) - Midline low back pain without sciatica, unspecified chronicity M79.671 (ICD-10-CM) - Pain in right foot  THERAPY DIAG:  Abnormal posture  Other low back pain  Pain in right ankle and joints of right foot  Difficulty in walking, not elsewhere classified  Rationale for Evaluation and Treatment: Rehabilitation  ONSET DATE: After his second hip replacement in December 2024  SUBJECTIVE:   SUBJECTIVE STATEMENT: He notes his back is feeling a lot better, he has been doing the stretches and they are helping, most of pain is in in Right achillies today  PERTINENT HISTORY: DM, Asthma, kidney stones, bilateral total hip replacements   PAIN:  Are you having pain? Yes: NPRS scale: 5/10 today Pain location: achillies today Pain description: Stiffness Aggravating factors: Tying shoes, putting on socks and shoes, stiff when getting out of truck after 15-20 minutes Relieving factors: Change of position, give it a second between change of positions  PRECAUTIONS: Back  RED FLAGS: None   WEIGHT BEARING RESTRICTIONS: No  FALLS:  Has patient fallen in last 6 months? No  LIVING ENVIRONMENT: Lives with: lives with their family Lives in: House/apartment Stairs:  Some difficulty after the recent THA Has following equipment at home: None  OCCUPATION: Landscape architect  PLOF: Independent  PATIENT GOALS: Be able to function normally, less pain  NEXT MD VISIT: 08/23/2023 with Alex Hunter "  OBJECTIVE:  Note: Objective measures were completed at Evaluation unless  otherwise noted.  DIAGNOSTIC FINDINGS: Lumbar spine 2 views: Status post bilateral total hip arthroplasties.  No  acute fractures.  Displays overall well-maintained.  Lower lumbar facet  arthritic changes.  No spondylolisthesis.  PATIENT SURVEYS:  Patient-Specific Activity Scoring Scheme  "0" represents "unable to perform." "10" represents "able to perform at prior level. 0 1 2 3 4 5 6 7 8 9  10 (Date and Score)   Activity Eval     1.  Put on socks and shoes  3    2.  Cut toenails  0    3.  Walk when getting out of the truck 4   4.    5.    Score 7/30    Total score = sum of the activity scores/number of activities Minimum detectable change (90%CI) for average score = 2 points Minimum detectable change (90%CI) for single activity score = 3 points     COGNITION: Overall cognitive status: Within functional limits for tasks assessed     SENSATION: WFL  EDEMA:  None noted  MUSCLE LENGTH: Hamstrings: Right 30 deg; Left 25 deg  POSTURE:  Alex Hunter has a good standing posture, but he is very stiff with all movement  AROM:  Passive ROM Left/Right 08/16/2023   Hip flexion 70/70   Hip extension    Hip abduction    Hip adduction    Hip internal rotation    Hip external rotation    Knee flexion    Knee extension    Ankle dorsiflexion Next visit   Ankle plantarflexion    Ankle inversion    Ankle eversion    Lumbar extension  5    (Blank rows = not tested)  STRENGTH:  MMT Left/Right 08/16/2023   Hip flexion    Hip extension    Hip abduction    Hip adduction    Hip internal rotation    Hip external rotation    Knee flexion    Knee extension    Ankle dorsiflexion    Ankle plantarflexion    Ankle inversion    Ankle eversion    Lumbar extension Unable to assume test postures at evaluation    (Blank rows = not tested)  GAIT: Distance walked: 50 feet Assistive device utilized: None Level of assistance: Complete Independence Comments: Alex Hunter has a stiff  gait  TREATMENT DATE:  08/24/2023 Nu step L6 X 6 min Gastroc stretch slantboard 30 sec X 3 bilat Soleus stretch slantboard 30 sec X 3 bilat Heel and toe raises X 15 bilat Eccentric heel raises up with 2, down with 1 Lumbar extension AROM, hips forward with hands on gluteals 10 x 3 seconds Single knee-to-chest stretch with other leg straight 3 x 30 seconds bilateral Supine hamstrings stretch with other leg straight 3 x 30 seconds bilateral  08/16/2023 Lumbar extension AROM, hips forward with hands on gluteals 10 x 3 seconds Single knee-to-chest stretch with other leg straight 4 x 20 seconds bilateral Supine hamstrings stretch with other leg straight 4 x 20 seconds bilateral  09811: Reviewed spine anatomy with the spine model, reviewed imaging, discussed the importance of frequent changes of position, golfers and diagonal squat lift, reviewed exam findings, day 1 home exercise program and prescribed walking 3 times a week for 20 minutes or more per episode   PATIENT EDUCATION:  Education details: See above Person educated: Patient Education method: Explanation, Demonstration, Tactile cues, Verbal cues, and Handouts Education comprehension: verbalized understanding, returned demonstration, verbal cues required, tactile cues required, and needs further education  HOME EXERCISE PROGRAM: Access Code: [email or web address] URL: [email or web address] Date: 08/24/2023 Prepared by: Jamee Mazzoni  Exercises - Standing Lumbar Extension at Wall - Forearms  - 5 x daily - 7 x weekly - 1 sets - 5 reps - 3 seconds hold - Single Knee to Chest Stretch  - 2-3 x daily - 7 x weekly - 1 sets - 5 reps - 20 seconds hold - Supine Hamstring Stretch  - 2-3 x daily - 7 x weekly - 1 sets - 5 reps - 20 seconds hold - Gastroc Stretch on Wall  - 2-3 x daily - 3 x weekly - 1 sets - 3 reps  - 30 sec hold - Soleus Stretch on Wall  - 2-3 x daily - 6 x weekly - 1 sets - 3 reps - 30 sec hold - Heel Toe Raises with Counter Support  - 2-3 x daily - 6 x weekly - 1 sets - 20 reps  ASSESSMENT:  CLINICAL IMPRESSION: He has been compliant with HEP for his back exercises and seeing good early progress. His biggest complaint was his Rt foot/achillis pain today to I showed him exercises for this today and added these into his HEP so he has back and foot exercises.   OBJECTIVE IMPAIRMENTS: Abnormal gait, decreased endurance, decreased knowledge of condition, difficulty walking, decreased ROM, impaired perceived functional ability, impaired flexibility, improper body mechanics, postural dysfunction, and pain.   ACTIVITY LIMITATIONS: lifting, bending, sitting, standing, squatting, stairs, dressing, and locomotion level  PARTICIPATION LIMITATIONS: cleaning, community activity, and occupation  PERSONAL FACTORS: DM, Asthma, kidney stones, bilateral total hip replacements are also affecting patient's functional outcome.    REHAB POTENTIAL: Good  CLINICAL DECISION MAKING: Stable/uncomplicated  EVALUATION COMPLEXITY: Low   GOALS: Goals reviewed with patient? Yes  SHORT TERM GOALS: Target date: 09/13/2023 Obrien will be independent with his day 1 home exercise program Baseline: Started 08/16/2023 Goal status: INITIAL  2.  Improve lumbar extension AROM to at least 10 degrees Baseline: 5 degrees Goal status: INITIAL  3.  Improve bilateral hip flexors flexibility to 80 degrees and hamstrings to 40 degrees Baseline: 70/70 and 25/30, respectively Goal status: INITIAL   LONG TERM GOALS: Target date: 10/11/2023  Improve patient specific functional score to at least 20/30 Baseline: 7/30 Goal status: INITIAL  2.  Bethel will  report low back pain consistently 0-2/10 on the numeric pain rating score Baseline: 0-5/10 Goal status: INITIAL  3.  Improve bilateral hip flexors flexibility to 90  degrees and hamstrings to 45 degrees Baseline: See above Goal status: INITIAL  4.  Cordell will be independent with his long-term home maintenance exercise program at discharge Baseline: Started 08/16/2023 Goal status: INITIAL  PLAN:  PT FREQUENCY: 1-2x/week  PT DURATION: 8 weeks  PLANNED INTERVENTIONS: 97110-Therapeutic exercises, 97530- Therapeutic activity, 97112- Neuromuscular re-education, 97535- Self Care, 16109- Manual therapy, [id number]- Gait training, Patient/Family education, Stair training, Dry Needling, Spinal mobilization, and Cryotherapy  PLAN FOR NEXT SESSION: mobility focus for back, hips, heel cords   Mick Alamin, PT, DPT 08/24/2023, 8:39 AM

## 2023-08-30 ENCOUNTER — Ambulatory Visit: Admitting: Physical Therapy

## 2023-09-06 ENCOUNTER — Ambulatory Visit (INDEPENDENT_AMBULATORY_CARE_PROVIDER_SITE_OTHER): Admitting: Sports Medicine

## 2023-09-06 DIAGNOSIS — G8929 Other chronic pain: Secondary | ICD-10-CM | POA: Diagnosis not present

## 2023-09-06 DIAGNOSIS — M7661 Achilles tendinitis, right leg: Secondary | ICD-10-CM

## 2023-09-06 DIAGNOSIS — M545 Low back pain, unspecified: Secondary | ICD-10-CM | POA: Diagnosis not present

## 2023-09-06 DIAGNOSIS — M9261 Juvenile osteochondrosis of tarsus, right ankle: Secondary | ICD-10-CM

## 2023-09-06 NOTE — Progress Notes (Signed)
 Patient says that he is doing a bit better but is still sore. He says that he has not used any of his Dosepak as he forgot that he had it. He has the most soreness when he has been sitting for awhile, but once he gets up and moving he feels okay.

## 2023-09-06 NOTE — Progress Notes (Signed)
 [name] - 68 y.o. male MRN [id number]  Date of birth: January 02, 1956  Office Visit Note: Visit Date: 09/06/2023 PCP: Azalia Leo, MD Referred by: Azalia Leo, MD  Subjective: Chief Complaint  Patient presents with   Right Heel - Follow-up   HPI: Alex Hunter is a pleasant 68 y.o. male who presents today for acute on chronic right heel pain. Also with some low back pain/stiffness.  Right heel -the right heel is doing much better.  He recently returned from [facility] with his family.  For the last week he has not had much pain in the heel, occasional soreness.  He actually forgot to take his Medrol  Dosepak so he has not used that yet.  Low back/legs -does have some known DDD of the lumbar spine.  He has some stiffness earlier in the morning and when he sits in his truck and goes to get up.  This will usually improve and loosen up as he gets walking for minutes at a time.  Occasionally he will have some stiffness/weakness in the proximal legs but no true numbness or tingling.  Pertinent ROS were reviewed with the patient and found to be negative unless otherwise specified above in HPI.   Assessment & Plan: Visit Diagnoses:  1. Haglund deformity of right heel   2. Achilles tendinitis, right leg   3. Chronic bilateral low back pain without sciatica    Plan: Impression is significantly improved right heel pain with a small Haglund deformity and distal Achilles tendinitis with enthesophytes.  He did receive good relief from shockwave therapy, given that his pain is very minimal at this time, we will hold on further treatments.  To help with the remainder of the inflammation as well as his flareup of his lumbar DDD, I would like him to start the Medrol  Dosepak we prescribed at last visit.  Discussed taking this for 3 days, if he feels great after that he may discontinue, if not he may complete the additional 3 days.  He will follow-up with me as needed.  Follow-up: Return  if symptoms worsen or fail to improve.   Meds & Orders: No orders of the defined types were placed in this encounter.   - Medrol  Dosepak 4mg  --> taper per instructions on packet   Procedures: N/a     Clinical History: No specialty comments available.  He reports that he quit smoking about 36 years ago. His smoking use included cigarettes. He started smoking about 46 years ago. He has a 5 pack-year smoking history. He has never used smokeless tobacco.  Recent Labs    04/14/23 0830  HGBA1C 6.3*    Objective:    Physical Exam  Gen: Well-appearing, in no acute distress; non-toxic CV: Well-perfused. Warm.  Resp: Breathing unlabored on room air; no wheezing. Psych: Fluid speech in conversation; appropriate affect; normal thought process  Ortho Exam - Right foot/ankle: There is no redness swelling or effusion of the ankle or heel.  There is no longer any tenderness over the superior calcaneus or the Achilles.  He does have a mild Achilles contracture with dorsiflexion about 85 degrees compared to 95 degrees of the contralateral side.  - Lumbar: There is good range of motion with flexion and extension of the lumbar spine, discomfort with extension.  There is full strength of bilateral lower extremities.  Imaging:  *Independent review and interpretation of 2 view lumbar spine x-ray from 08/07/2023 was performed by myself today.  X-rays demonstrate  mild degenerative disc disease with mild to moderate lower lumbar facet arthritic change.  There is no listhesis.  No acute fracture.  Lumbar spine 2 views: Status post bilateral total hip arthroplasties.  No  acute fractures.  Displays overall well-maintained.  Lower lumbar facet  arthritic changes.  No spondylolisthesis.   Past Medical/Family/Surgical/Social History: Medications & Allergies reviewed per EMR, new medications updated. Patient Active Problem List   Diagnosis Date Noted   Status post total replacement of left hip 04/21/2023    Trochanteric bursitis of left hip 06/03/2017   Essential hypertension 03/06/2012   Perirectal abscess 03/06/2012   Hyperlipidemia 11/19/2009   Past Medical History:  Diagnosis Date   Anxiety    Arthritis    "right hip" (01/09/2017)   Asthma    DM (diabetes mellitus) (HCC)    High blood pressure    High cholesterol    History of kidney stones    Family History  Problem Relation Age of Onset   Heart disease Mother    Leukemia Brother    Heart attack Brother    Colon cancer Neg Hx    Colon polyps Neg Hx    Esophageal cancer Neg Hx    Rectal cancer Neg Hx    Stomach cancer Neg Hx    Past Surgical History:  Procedure Laterality Date   COLONOSCOPY  06/21/2022   COLONOSCOPY  09/03/2009   F/V, Blenda Burdock, excellent prep, normal   DENTAL SURGERY     "had an implant" (01/09/2017)   TOTAL HIP ARTHROPLASTY Right 01/09/2017   TOTAL HIP ARTHROPLASTY Right 01/09/2017   Procedure: RIGHT TOTAL HIP ARTHROPLASTY ANTERIOR APPROACH;  Surgeon: Adonica Hoose, MD;  Location: [facility];  Service: Orthopedics;  Laterality: Right;   TOTAL HIP ARTHROPLASTY Left 04/21/2023   Procedure: LEFT TOTAL HIP ARTHROPLASTY ANTERIOR APPROACH;  Surgeon: Arnie Lao, MD;  Location: [facility];  Service: Orthopedics;  Laterality: Left;   Social History   Occupational History   Not on file  Tobacco Use   Smoking status: Former    Current packs/day: 0.00    Average packs/day: 0.5 packs/day for 10.0 years (5.0 ttl pk-yrs)    Types: Cigarettes    Start date: 62    Quit date: 1989    Years since quitting: 36.3   Smokeless tobacco: Never   Tobacco comments:    "quit smoking in the 1980s"  Vaping Use   Vaping status: Never Used  Substance and Sexual Activity   Alcohol use: Not Currently    Comment: once every year   Drug use: Not Currently    Types: Cocaine    Comment: "went thru treatment back in the 1980s"   Sexual activity: Yes

## 2023-09-08 ENCOUNTER — Telehealth: Payer: Self-pay | Admitting: Physical Therapy

## 2023-09-08 ENCOUNTER — Encounter: Admitting: Physical Therapy

## 2023-09-08 NOTE — Telephone Encounter (Signed)
 No-show for PT. Called and spoke to pt, he apologizes, had to bring an item to his son at school last minute and forgot appt. Plans to be at next PT appt 5/16.   Terrel Ferries, PT, DPT 09/08/23 8:19 AM

## 2023-09-15 ENCOUNTER — Ambulatory Visit: Admitting: Rehabilitative and Restorative Service Providers"

## 2023-09-15 ENCOUNTER — Encounter: Payer: Self-pay | Admitting: Rehabilitative and Restorative Service Providers"

## 2023-09-15 DIAGNOSIS — M5459 Other low back pain: Secondary | ICD-10-CM

## 2023-09-15 DIAGNOSIS — M25571 Pain in right ankle and joints of right foot: Secondary | ICD-10-CM | POA: Diagnosis not present

## 2023-09-15 DIAGNOSIS — R262 Difficulty in walking, not elsewhere classified: Secondary | ICD-10-CM

## 2023-09-15 DIAGNOSIS — R293 Abnormal posture: Secondary | ICD-10-CM

## 2023-09-15 NOTE — Therapy (Addendum)
 OUTPATIENT PHYSICAL THERAPY LUMBAR & LOWER EXTREMITY TREATMENT  PHYSICAL THERAPY DISCHARGE SUMMARY  Visits from Start of Care: 3  Current functional level related to goals / functional outcomes: See note   Remaining deficits: See note   Education / Equipment: HEP   Patient agrees to discharge. Patient goals were partially met. Patient is being discharged due to not returning since the last visit.   Referring diagnosis? M54.50 (ICD-10-CM) - Midline low back pain without sciatica, unspecified chronicity M79.671 (ICD-10-CM) - Pain in right foot  Treatment diagnosis? (if different than referring diagnosis) R29.3   M54.59   M25.571   R26.2 What was this (referring dx) caused by? []  Surgery []  Fall []  Ongoing issue [x]  Arthritis []  Other: ____________  Laterality: []  Rt []  Lt [x]  Both  Check all possible CPT codes:  *CHOOSE 10 OR LESS*    See Planned Interventions listed in the Plan section of the Evaluation.    Patient Name: Alex Hunter MRN: [id number] DOB:04/04/1956, 68 y.o., male Today's Date: 03/27/2024  END OF SESSION:     Past Medical History:  Diagnosis Date   Anxiety    Arthritis    right hip (01/09/2017)   Asthma    DM (diabetes mellitus) (HCC)    High blood pressure    High cholesterol    History of kidney stones    Past Surgical History:  Procedure Laterality Date   COLONOSCOPY  06/21/2022   COLONOSCOPY  09/03/2009   F/V, Lamar Aho, excellent prep, normal   DENTAL SURGERY     had an implant (01/09/2017)   TOTAL HIP ARTHROPLASTY Right 01/09/2017   TOTAL HIP ARTHROPLASTY Right 01/09/2017   Procedure: RIGHT TOTAL HIP ARTHROPLASTY ANTERIOR APPROACH;  Surgeon: Fidel Rogue, MD;  Location: [facility];  Service: Orthopedics;  Laterality: Right;   TOTAL HIP ARTHROPLASTY Left 04/21/2023   Procedure: LEFT TOTAL HIP ARTHROPLASTY ANTERIOR APPROACH;  Surgeon: Vernetta Lonni GRADE, MD;  Location: [facility];  Service: Orthopedics;  Laterality: Left;    Patient Active Problem List   Diagnosis Date Noted   Status post total replacement of left hip 04/21/2023   Trochanteric bursitis of left hip 06/03/2017   Essential hypertension 03/06/2012   Perirectal abscess 03/06/2012   Hyperlipidemia 11/19/2009    PCP: Leita VEAR Blind, MD  REFERRING PROVIDER: Bertrum LELON Gaskins, PA-C  REFERRING DIAG: M54.50 (ICD-10-CM) - Midline low back pain without sciatica, unspecified chronicity M79.671 (ICD-10-CM) - Pain in right foot  THERAPY DIAG:  Abnormal posture  Other low back pain  Pain in right ankle and joints of right foot  Difficulty in walking, not elsewhere classified  Rationale for Evaluation and Treatment: Rehabilitation  ONSET DATE: After his second hip replacement in December 2024  SUBJECTIVE:   SUBJECTIVE STATEMENT: Dameer notes his low back has been doing very well.  He is now able to cut his toe nails and get socks and shoes off with less of a struggle.  The bottom of his right foot is most irritating.  PERTINENT HISTORY: DM, Asthma, kidney stones, bilateral total hip replacements   PAIN:  Are you having pain? Yes: NPRS scale: 1-2/10 low back, Rt bottom of the foot 2-4/10 today Pain location: Rt bottom of the foot Pain description: Stiffness Aggravating factors: Standing after prolonged sitting, Tying shoes, putting on socks and shoes are much better, stiff when getting out of truck after 15-20 minutes Relieving factors: Change of position, give it a second between change of positions  PRECAUTIONS: Back  RED FLAGS:  None   WEIGHT BEARING RESTRICTIONS: No  FALLS:  Has patient fallen in last 6 months? No  LIVING ENVIRONMENT: Lives with: lives with their family Lives in: House/apartment Stairs: Some difficulty after the recent THA Has following equipment at home: None  OCCUPATION: Landscape architect  PLOF: Independent  PATIENT GOALS: Be able to function normally, less pain  NEXT MD VISIT: NA  OBJECTIVE:   Note: Objective measures were completed at Evaluation unless otherwise noted.  DIAGNOSTIC FINDINGS: Lumbar spine 2 views: Status post bilateral total hip arthroplasties.  No  acute fractures.  Displays overall well-maintained.  Lower lumbar facet  arthritic changes.  No spondylolisthesis.  PATIENT SURVEYS:  Patient-Specific Activity Scoring Scheme  0 represents "unable to perform." 10 represents "able to perform at prior level. 0 1 2 3 4 5 6 7 8 9  10 (Date and Score)   Activity Eval   09/15/2023  1.  Put on socks and shoes  3  6  2.  Cut toenails  0  6  3.  Walk when getting out of the truck 4 5  4.    5.    Score 2.34 5.67   Total score = sum of the activity scores/number of activities Minimum detectable change (90%CI) for average score = 2 points Minimum detectable change (90%CI) for single activity score = 3 points     COGNITION: Overall cognitive status: Within functional limits for tasks assessed     SENSATION: WFL  EDEMA:  None noted  MUSCLE LENGTH: 09/15/2023 Hamstrings: Right 35 deg; Left 30 deg  Eval: Hamstrings: Right 30 deg; Left 25 deg  POSTURE: Quenton has a good standing posture, but he is very stiff with all movement  AROM:  Passive ROM Left/Right 08/16/2023 Left/Right 09/15/2023  Hip flexion 70/70 80/80  Hip extension    Hip abduction    Hip adduction    Hip internal rotation    Hip external rotation    Knee flexion    Knee extension    Ankle dorsiflexion Next visit 6/6  Ankle plantarflexion    Ankle inversion    Ankle eversion    Lumbar extension  5    (Blank rows = not tested)  STRENGTH:  MMT Left/Right 08/16/2023   Hip flexion    Hip extension    Hip abduction    Hip adduction    Hip internal rotation    Hip external rotation    Knee flexion    Knee extension    Ankle dorsiflexion    Ankle plantarflexion    Ankle inversion    Ankle eversion    Lumbar extension Unable to assume test postures at evaluation    (Blank rows =  not tested)  GAIT: Distance walked: 50 feet Assistive device utilized: None Level of assistance: Complete Independence Comments: Hugh has a stiff gait  TREATMENT DATE:  09/15/2023 Lumbar extension AROM, hips forward with hands on gluteals 10 x 3 seconds Single knee-to-chest stretch with other leg straight 5 x 20 seconds bilateral Supine hamstrings stretch with other leg straight 5 x 20 seconds bilateral Supine knee to opposite shoulder (gluteal stretch) 5 x 20 seconds Upper heel cord slant board (slight toe in) 2 x 1 minute Lower heel cord slant board (slight toe in) 2 x 1 minute Upper and lower heel cord stretch (slight toe in) 1 x 20 seconds each Heel to toe raises 15 x 3 seconds  02464: Reviewed HEP, spine anatomy with the spine model, reviewed log roll, the importance of frequent changes of position, reviewed re-exam findings, day 1 & 2 home exercise program    08/24/2023 Nu step L6 X 6 min Gastroc stretch slantboard 30 sec X 3 bilat Soleus stretch slantboard 30 sec X 3 bilat Heel and toe raises X 15 bilat Eccentric heel raises up with 2, down with 1 Lumbar extension AROM, hips forward with hands on gluteals 10 x 3 seconds Single knee-to-chest stretch with other leg straight 3 x 30 seconds bilateral Supine hamstrings stretch with other leg straight 3 x 30 seconds bilateral   08/16/2023 Lumbar extension AROM, hips forward with hands on gluteals 10 x 3 seconds Single knee-to-chest stretch with other leg straight 4 x 20 seconds bilateral Supine hamstrings stretch with other leg straight 4 x 20 seconds bilateral  97535: Reviewed spine anatomy with the spine model, reviewed imaging, discussed the importance of frequent changes of position, golfers and diagonal squat lift, reviewed exam findings, day 1 home exercise program and prescribed walking 3 times a week  for 20 minutes or more per episode   PATIENT EDUCATION:  Education details: See above Person educated: Patient Education method: Explanation, Demonstration, Tactile cues, Verbal cues, and Handouts Education comprehension: verbalized understanding, returned demonstration, verbal cues required, tactile cues required, and needs further education  HOME EXERCISE PROGRAM: Access Code: [email or web address] URL: [email or web address] Date: 09/15/2023 Prepared by: Lamar Ivory  Exercises - Standing Lumbar Extension at Wall - Forearms  - 5 x daily - 7 x weekly - 1 sets - 5 reps - 3 seconds hold - Single Knee to Chest Stretch  - 1-2 x daily - 7 x weekly - 1 sets - 5 reps - 20 seconds hold - Supine Hamstring Stretch  - 1-2 x daily - 7 x weekly - 1 sets - 5 reps - 20 seconds hold - Gastroc Stretch on Wall  - 1-2 x daily - 7 x weekly - 1 sets - 3 reps - 30 sec hold - Soleus Stretch on Wall  - 1-2 x daily - 7 x weekly - 1 sets - 3 reps - 30 sec hold - Heel Toe Raises with Counter Support  - 3-5 x daily - 7 x weekly - 1 sets - 10 reps - 3 seconds hold - Supine Gluteus Stretch  - 1-2 x daily - 7 x weekly - 1 sets - 5 reps - 20 seconds hold - Slant Board Gastrocnemius Stretch  - 1-2 x daily - 7 x weekly - 1 sets - 3 reps - 60 seconds hold - Slant Board Soleus Stretch  - 1-2 x daily - 7 x weekly - 1 sets - 3 reps - 60 seconds hold  ASSESSMENT:  CLINICAL IMPRESSION: Jad notes significantly improved low back pain as compared to evaluation.  His right Achilles pain is also improved, although he still has some plantar fasciitis  type symptoms.  We reviewed his current program and made some progressions to address his remaining plantar fasciitis symptoms.  Kainan demonstrated good understanding of these activities and will follow-up to review these, take objective measures and consider transfer into independent rehabilitation as symptoms continue to improve.  OBJECTIVE IMPAIRMENTS: Abnormal gait,  decreased endurance, decreased knowledge of condition, difficulty walking, decreased ROM, impaired perceived functional ability, impaired flexibility, improper body mechanics, postural dysfunction, and pain.   ACTIVITY LIMITATIONS: lifting, bending, sitting, standing, squatting, stairs, dressing, and locomotion level  PARTICIPATION LIMITATIONS: cleaning, community activity, and occupation  PERSONAL FACTORS: DM, Asthma, kidney stones, bilateral total hip replacements are also affecting patient's functional outcome.    REHAB POTENTIAL: Good  CLINICAL DECISION MAKING: Stable/uncomplicated  EVALUATION COMPLEXITY: Low   GOALS: Goals reviewed with patient? Yes  SHORT TERM GOALS: Target date: 09/13/2023 Deakin will be independent with his day 1 home exercise program Baseline: Started 08/16/2023 Goal status: Met 09/15/2023  2.  Improve lumbar extension AROM to at least 10 degrees Baseline: 5 degrees Goal status: INITIAL  3.  Improve bilateral hip flexors flexibility to 80 degrees and hamstrings to 40 degrees Baseline: 70/70 and 25/30, respectively Goal status: Partially Met 09/15/2023   LONG TERM GOALS: Target date: 10/11/2023  Improve patient specific functional score to at least 20/30 Baseline: 7/30 Goal status: Ongoing 09/15/2023  2.  Masami will report low back pain consistently 0-2/10 on the numeric pain rating score Baseline: 0-5/10 Goal status: Met 09/15/2023  3.  Improve bilateral hip flexors flexibility to 90 degrees and hamstrings to 45 degrees Baseline: See above Goal status: Ongoing 09/15/2023  4.  Niraj will be independent with his long-term home maintenance exercise program at discharge Baseline: Started 08/16/2023 Goal status: Ongoing 09/15/2023  PLAN:  PT FREQUENCY: 1-2x/week  PT DURATION: 3 to 4 weeks  PLANNED INTERVENTIONS: 97110-Therapeutic exercises, 97530- Therapeutic activity, 97112- Neuromuscular re-education, 97535- Self Care, 02859- Manual therapy,  [phone]- Gait training, Patient/Family education, Stair training, Dry Needling, Spinal mobilization, and Cryotherapy  PLAN FOR NEXT SESSION: Review his current home exercises, reassess ankle dorsiflexion active ROM and trunk extension active range of motion.  Consider transfer into independent rehabilitation if all long-term goals are met.   Myer LELON Ivory, PT, MPT 03/27/2024, 1:53 PM

## 2023-09-22 ENCOUNTER — Encounter: Admitting: Rehabilitative and Restorative Service Providers"

## 2023-09-29 ENCOUNTER — Telehealth: Payer: Self-pay | Admitting: Rehabilitative and Restorative Service Providers"

## 2023-09-29 ENCOUNTER — Encounter: Admitting: Rehabilitative and Restorative Service Providers"

## 2023-09-29 NOTE — Telephone Encounter (Signed)
 Left VM to call [phone] if he wants to schedule additional appointments.

## 2023-11-24 DIAGNOSIS — E1159 Type 2 diabetes mellitus with other circulatory complications: Secondary | ICD-10-CM | POA: Diagnosis not present

## 2023-11-24 DIAGNOSIS — Z6832 Body mass index (BMI) 32.0-32.9, adult: Secondary | ICD-10-CM | POA: Diagnosis not present

## 2023-11-24 DIAGNOSIS — I10 Essential (primary) hypertension: Secondary | ICD-10-CM | POA: Diagnosis not present

## 2023-11-24 DIAGNOSIS — M65342 Trigger finger, left ring finger: Secondary | ICD-10-CM | POA: Diagnosis not present

## 2023-11-24 DIAGNOSIS — E669 Obesity, unspecified: Secondary | ICD-10-CM | POA: Diagnosis not present

## 2023-11-24 DIAGNOSIS — E785 Hyperlipidemia, unspecified: Secondary | ICD-10-CM | POA: Diagnosis not present

## 2023-11-24 DIAGNOSIS — Z713 Dietary counseling and surveillance: Secondary | ICD-10-CM | POA: Diagnosis not present

## 2023-11-27 DIAGNOSIS — M65342 Trigger finger, left ring finger: Secondary | ICD-10-CM | POA: Diagnosis not present

## 2024-01-04 ENCOUNTER — Encounter: Payer: Self-pay | Admitting: Physician Assistant

## 2024-01-04 ENCOUNTER — Ambulatory Visit: Admitting: Physician Assistant

## 2024-01-04 DIAGNOSIS — M545 Low back pain, unspecified: Secondary | ICD-10-CM | POA: Diagnosis not present

## 2024-01-04 MED ORDER — HYDROCODONE-ACETAMINOPHEN 5-325 MG PO TABS: 1.0000 | ORAL_TABLET | Freq: Four times a day (QID) | ORAL | 0 refills | Status: AC | PRN

## 2024-01-04 NOTE — Progress Notes (Signed)
 HPI: Alex Hunter returns today for follow-up of his low back pain and Achilles pain.  He states his Achilles pain went away with physical therapy.  However his back pain continues despite physical therapy.  He also has continued his home exercise program for the back pain.  He states his back pain is worse when first standing or if he standing still.  No radicular symptoms.  Rates his back pain to be 9 out of 10 pain at worst.  Pain does awaken him at night.  No change in weight, bowel or bladder disc function, fevers chills, or saddle anesthesia like symptoms.  He has taken the meloxicam  without any relief.  He also states that the Zanaflex  gives him no relief.  He had some old oxycodone  leftover and he felt that this was of some benefit he is asking for some pain medication due to the pain that he is having.  Review of systems: See HPI otherwise negative  Physical exam: General Well-developed well-nourished male no acute distress walks with a slight antalgic gait.  No assistive device. Vascular dorsal pedal pulses are intact. Lower extremities: Tight hamstrings bilaterally.  Negative straight leg raise.  5 out of 5 strength throughout lower extremities against resistance.  Sensation grossly intact bilateral feet to light touch.  Good range of motion both hips without pain.  Slight tenderness over the left hip trochanteric region.  Nontender over the lumbar spine and paraspinous region of the lumbar spine.  He comes within 2 inches of being able to touch his toes.  He has limited extension lumbar spine.  He is able to walk on his heels and tiptoes.  Negative tandem gait.  Impression: Low back pain without radiculopathy  Plan: Given the fact the patient's failed conservative treatment which is included therapy medications and he continues to have low back pain that awakens and recommend MRI to rule out HNP as source of his pain versus facet joint arthritis.  Will have him back after the MRI to go over  results and discuss further treatment.  Questions were encouraged and answered at length.  He was given hydrocodone  for pain he is to use this sparingly.

## 2024-01-05 ENCOUNTER — Other Ambulatory Visit: Payer: Self-pay | Admitting: Radiology

## 2024-01-05 DIAGNOSIS — M545 Low back pain, unspecified: Secondary | ICD-10-CM

## 2024-01-10 ENCOUNTER — Encounter: Payer: Self-pay | Admitting: Physician Assistant

## 2024-01-14 ENCOUNTER — Inpatient Hospital Stay
Admission: RE | Admit: 2024-01-14 | Discharge: 2024-01-14 | Discharge status: Home / Self Care | Source: Ambulatory Visit | Attending: Physician Assistant | Admitting: Physician Assistant

## 2024-01-14 DIAGNOSIS — M47817 Spondylosis without myelopathy or radiculopathy, lumbosacral region: Secondary | ICD-10-CM | POA: Diagnosis not present

## 2024-01-14 DIAGNOSIS — M48061 Spinal stenosis, lumbar region without neurogenic claudication: Secondary | ICD-10-CM | POA: Diagnosis not present

## 2024-01-14 DIAGNOSIS — M545 Low back pain, unspecified: Secondary | ICD-10-CM

## 2024-01-14 DIAGNOSIS — M5127 Other intervertebral disc displacement, lumbosacral region: Secondary | ICD-10-CM | POA: Diagnosis not present

## 2024-01-22 ENCOUNTER — Ambulatory Visit: Admitting: Physician Assistant

## 2024-01-29 ENCOUNTER — Ambulatory Visit: Admitting: Physician Assistant

## 2024-01-29 ENCOUNTER — Encounter: Payer: Self-pay | Admitting: Physician Assistant

## 2024-01-29 ENCOUNTER — Other Ambulatory Visit: Payer: Self-pay | Admitting: Radiology

## 2024-01-29 DIAGNOSIS — M545 Low back pain, unspecified: Secondary | ICD-10-CM

## 2024-01-29 NOTE — Progress Notes (Signed)
 HPI: [name] returns today to go over the MRI of his lumbar spine.  He continues to have low back pain when sitting down or getting up from sitting.  He states he is very stiff.  He also has some low back pain at night.  No significant radicular symptoms.  Ranks his pain to be 8 out of 10 pain.  Does note that he has numbness tingling both feet but feels this is due to neuropathy and his diabetes.  He reports currently under good control in regards to his to his diabetes.  He continues to do home exercises as taught by therapy for his back but does state that the stretching seems to help some. MRI images are reviewed with the patient.  Shows L2-3 right eccentric: With central annular fissure which results in mild spinal canal stenosis with moderate left subarticular mass effect on the trans left L3 nerve root.  L3-4 moderate facet arthropathy with periarticular edema resulting in mild spinal canal stenosis and moderate right neuroforaminal stenosis.  L4-5 moderate facet arthritis with periarticular edema resulting in severe narrowing right lateral recess which compresses the right L5 traversing nerve root.  Review of systems: See HPI otherwise negative  Physical exam: General Well-developed well-nourished male no acute distress mood affect appropriate.  Psych: Alert and oriented x 3 Lumbar spine: Limited flexion extension lumbar spine.  Negative straight leg raise.  He comes within a few inches of being able to touch his toes with lumbar flexion.  Impression: Low back pain without radicular symptoms  Plan: Will have him see Dr. Eldonna for ESI injections L-spine possible facet injections.  Follow-up with us  if pain persist or becomes worse.  Questions were encouraged and answered at length.  Encouraged patient to continue home exercise program as taught by therapy.

## 2024-02-13 ENCOUNTER — Ambulatory Visit: Admitting: Physical Medicine and Rehabilitation

## 2024-02-13 ENCOUNTER — Encounter: Payer: Self-pay | Admitting: Physical Medicine and Rehabilitation

## 2024-02-13 DIAGNOSIS — M47819 Spondylosis without myelopathy or radiculopathy, site unspecified: Secondary | ICD-10-CM

## 2024-02-13 DIAGNOSIS — M47816 Spondylosis without myelopathy or radiculopathy, lumbar region: Secondary | ICD-10-CM | POA: Diagnosis not present

## 2024-02-13 DIAGNOSIS — G8929 Other chronic pain: Secondary | ICD-10-CM

## 2024-02-13 DIAGNOSIS — M545 Low back pain, unspecified: Secondary | ICD-10-CM | POA: Diagnosis not present

## 2024-02-13 MED ORDER — OXYCODONE-ACETAMINOPHEN 5-325 MG PO TABS: 1.0000 | ORAL_TABLET | Freq: Three times a day (TID) | ORAL | 0 refills | Status: AC | PRN

## 2024-02-13 NOTE — Progress Notes (Signed)
 Alex Hunter - 68 y.o. male MRN [id number]  Date of birth: 05/10/1955  Office Visit Note: Visit Date: 02/13/2024 PCP: Stephane Leita DEL, MD Referred by: Stephane Leita DEL, MD  Subjective: Chief Complaint  Patient presents with   Lower Back - Pain   HPI: Alex Hunter is a 68 y.o. male who comes in today per the request of Bertrum Gaskins, PA for evaluation of chronic, worsening and severe bilateral lower back pain. Pain started several years ago (2018) after undergoing right total hip arthroplasty with Dr. Fidel. His pain becomes severe when moving from sitting to standing position. He describes pain as sore and stiff sensation, currently rates as 7 out of 10. Some relief of pain with home exercise regimen, rest and use of medications. Some relief of pain with recent formal physical therapy. He reports significant relief of pain with Oxycodone . Recent lumbar MRI imaging shows moderate bilateral facet arthropathy with periarticular edema at L3-L4 and L4-L5. There is multi level lateral recess stenosis. No high grade spinal canal stenosis. No history of lumbar surgery/injections. Patient denies focal weakness, numbness and tingling. No recent trauma or falls.       Review of Systems  Musculoskeletal:  Positive for back pain.  Neurological:  Negative for tingling, sensory change, focal weakness and weakness.  All other systems reviewed and are negative.  Otherwise per HPI.  Assessment & Plan: Visit Diagnoses:    ICD-10-CM   1. Chronic bilateral low back pain without sciatica  M54.50 Ambulatory referral to Physical Medicine Rehab   G89.29     2. Spondylosis without myelopathy or radiculopathy  M47.819 Ambulatory referral to Physical Medicine Rehab    3. Facet arthropathy, lumbar  M47.816 Ambulatory referral to Physical Medicine Rehab       Plan: Findings:  Chronic, worsening and severe bilateral axial back pain.  No radicular symptoms down the legs.  Patient continues to have  severe pain despite good conservative therapy such as formal physical therapy, home exercise regimen, rest and use of medications.  Patient's clinical presentation and exam are consistent with facet mediated pain.  He does have pain with lumbar extension today.  I discussed recent lumbar MRI today using imaging and spine model.  There is a moderate facet arthropathy with periarticular edema at the levels of L3-L4 and L4-L5.  We discussed treatment plan in detail today.  Next step is to perform diagnostic bilateral L3-L4 and L4-L5 medial branch blocks under fluoroscopic guidance.  If good relief of pain with diagnostic blocks we discussed the possibility of longer sustained pain relief with radiofrequency ablation. I discussed medial branch block and ablation procedure in detail today, he has no questions. I prescribed short course of Percocet for him while we are waiting on approval for injections. No red flag symptoms noted upon exam today.     Meds & Orders:  Meds ordered this encounter  Medications   oxyCODONE -acetaminophen  (PERCOCET/ROXICET) 5-325 MG tablet    Sig: Take 1 tablet by mouth every 8 (eight) hours as needed for severe pain (pain score 7-10).    Dispense:  20 tablet    Refill:  0    Orders Placed This Encounter  Procedures   Ambulatory referral to Physical Medicine Rehab    Follow-up: Return for Bilateral L3-L4 and L4-L5 medial branch blocks.   Procedures: No procedures performed      Clinical History: EXAM: MRI LUMBAR SPINE 01/14/2024 07:34:55 AM   TECHNIQUE: Multiplanar multisequence MRI of the lumbar spine  was performed without the administration of intravenous contrast.   COMPARISON: Lumbar spine radiographs 11/06/2023.   CLINICAL HISTORY: Spinal stenosis, lumbar; R/O HNP/FACET JOINT. Back hurts when standing up and sitting; NKI; Problem happening since back surgery.   FINDINGS:   BONES AND ALIGNMENT: Conventional lumbosacral anatomy is assumed with 5  non-rib-bearing, lumbar-type vertebral bodies. Benign vertebral hemangioma in the T12 and L4 vertebral bodies. Congenitally short pedicles throughout the lumbar spine.   SPINAL CORD: Conus terminates at L1.   SOFT TISSUES: No paraspinal mass.   L1-L2: No significant disc herniation. No spinal canal stenosis or neural foraminal narrowing.   L2-L3: Right eccentric disc bulge with central annular fissure results in mild spinal canal stenosis with moderate narrowing of the left subarticular zone resulting in mass effect on the traversing left L3 nerve root. No significant neural foraminal narrowing.   L3-L4: Disc bulge and moderate bilateral facet arthropathy with periarticular edema result in mild spinal canal stenosis and moderate right neural foraminal narrowing.   L4-L5: Disc bulge and moderate bilateral facet arthropathy with periarticular edema result in severe narrowing of the right lateral recess with compression of the traversing right L5 nerve root. No significant neural foraminal narrowing.   L5-S1: Small disc bulge and mild bilateral facet arthropathy without spinal canal stenosis or neural foraminal narrowing.   IMPRESSION: 1. Multilevel lumbar spondylosis, with greatest severity at L4-5 causing severe right lateral recess stenosis. 2. At L2-3, moderate left subarticular zone narrowing with mass effect on the traversing left L3 nerve root. 3. Moderate bilateral facet arthropathy with periarticular edema at L3-L4 and L4-L5.   Electronically signed by: Ryan Chess MD 01/19/2024 08:45 AM EDT RP Workstation: [id number]   He reports that he quit smoking about 36 years ago. His smoking use included cigarettes. He started smoking about 46 years ago. He has a 5 pack-year smoking history. He has never used smokeless tobacco.  Recent Labs    04/14/23 0830  HGBA1C 6.3*    Objective:  VS:  HT:    WT:   BMI:     BP:   HR: bpm  TEMP: ( )  RESP:  Physical  Exam Vitals and nursing note reviewed.  HENT:     Head: Normocephalic and atraumatic.     Right Ear: External ear normal.     Left Ear: External ear normal.     Nose: Nose normal.     Mouth/Throat:     Mouth: Mucous membranes are moist.  Eyes:     Extraocular Movements: Extraocular movements intact.  Cardiovascular:     Rate and Rhythm: Normal rate.     Pulses: Normal pulses.  Pulmonary:     Effort: Pulmonary effort is normal.  Abdominal:     General: Abdomen is flat. There is no distension.  Musculoskeletal:        General: Tenderness present.     Comments: Patient rises from seated position to standing without difficulty. Pain noted with facet loading and lumbar extension. 5/5 strength noted with bilateral hip flexion, knee flexion/extension, ankle dorsiflexion/plantarflexion and EHL. No clonus noted bilaterally. No pain upon palpation of greater trochanters. No pain with internal/external rotation of bilateral hips. Sensation intact bilaterally. Negative slump test bilaterally. Ambulates without aid, gait steady.     Skin:    General: Skin is warm and dry.     Capillary Refill: Capillary refill takes less than 2 seconds.  Neurological:     General: No focal deficit present.     Mental  Status: He is alert and oriented to person, place, and time.  Psychiatric:        Mood and Affect: Mood normal.        Behavior: Behavior normal.     Ortho Exam  Imaging: No results found.  Past Medical/Family/Surgical/Social History: Medications & Allergies reviewed per EMR, new medications updated. Patient Active Problem List   Diagnosis Date Noted   Status post total replacement of left hip 04/21/2023   Trochanteric bursitis of left hip 06/03/2017   Essential hypertension 03/06/2012   Perirectal abscess 03/06/2012   Hyperlipidemia 11/19/2009   Past Medical History:  Diagnosis Date   Anxiety    Arthritis    right hip (01/09/2017)   Asthma    DM (diabetes mellitus) (HCC)     High blood pressure    High cholesterol    History of kidney stones    Family History  Problem Relation Age of Onset   Heart disease Mother    Leukemia Brother    Heart attack Brother    Colon cancer Neg Hx    Colon polyps Neg Hx    Esophageal cancer Neg Hx    Rectal cancer Neg Hx    Stomach cancer Neg Hx    Past Surgical History:  Procedure Laterality Date   COLONOSCOPY  06/21/2022   COLONOSCOPY  09/03/2009   F/V, Lamar Aho, excellent prep, normal   DENTAL SURGERY     had an implant (01/09/2017)   TOTAL HIP ARTHROPLASTY Right 01/09/2017   TOTAL HIP ARTHROPLASTY Right 01/09/2017   Procedure: RIGHT TOTAL HIP ARTHROPLASTY ANTERIOR APPROACH;  Surgeon: Fidel Rogue, MD;  Location: [facility] OR;  Service: Orthopedics;  Laterality: Right;   TOTAL HIP ARTHROPLASTY Left 04/21/2023   Procedure: LEFT TOTAL HIP ARTHROPLASTY ANTERIOR APPROACH;  Surgeon: Vernetta Lonni GRADE, MD;  Location: [facility];  Service: Orthopedics;  Laterality: Left;   Social History   Occupational History   Not on file  Tobacco Use   Smoking status: Former    Current packs/day: 0.00    Average packs/day: 0.5 packs/day for 10.0 years (5.0 ttl pk-yrs)    Types: Cigarettes    Start date: 34    Quit date: 1989    Years since quitting: 36.8   Smokeless tobacco: Never   Tobacco comments:    quit smoking in the 1980s  Vaping Use   Vaping status: Never Used  Substance and Sexual Activity   Alcohol use: Not Currently    Comment: once every year   Drug use: Not Currently    Types: Cocaine    Comment: went thru treatment back in the 1980s   Sexual activity: Yes

## 2024-02-13 NOTE — Progress Notes (Signed)
 Pain Scale   Average Pain 5 Patient advising he has lower back pain that increases when getting up after sitting and decreases when walking., Patient has has bilateral hip replacements and advised his pain started in his lower back after the 1st hip replacement.        +Driver, -BT, -Dye Allergies.

## 2024-02-21 ENCOUNTER — Telehealth: Payer: Self-pay | Admitting: Physician Assistant

## 2024-02-21 NOTE — Telephone Encounter (Signed)
 Patient called and wants a form for the handicap sticker. CB#[phone]

## 2024-02-22 NOTE — Telephone Encounter (Signed)
 Called patient. Place up front for pick up

## 2024-02-27 DIAGNOSIS — Z125 Encounter for screening for malignant neoplasm of prostate: Secondary | ICD-10-CM | POA: Diagnosis not present

## 2024-02-27 DIAGNOSIS — I10 Essential (primary) hypertension: Secondary | ICD-10-CM | POA: Diagnosis not present

## 2024-02-27 DIAGNOSIS — E785 Hyperlipidemia, unspecified: Secondary | ICD-10-CM | POA: Diagnosis not present

## 2024-02-27 DIAGNOSIS — E1159 Type 2 diabetes mellitus with other circulatory complications: Secondary | ICD-10-CM | POA: Diagnosis not present

## 2024-02-27 DIAGNOSIS — Z0189 Encounter for other specified special examinations: Secondary | ICD-10-CM | POA: Diagnosis not present

## 2024-03-04 ENCOUNTER — Encounter: Payer: Self-pay | Admitting: Radiology

## 2024-03-05 DIAGNOSIS — Z6832 Body mass index (BMI) 32.0-32.9, adult: Secondary | ICD-10-CM | POA: Diagnosis not present

## 2024-03-05 DIAGNOSIS — I10 Essential (primary) hypertension: Secondary | ICD-10-CM | POA: Diagnosis not present

## 2024-03-05 DIAGNOSIS — E785 Hyperlipidemia, unspecified: Secondary | ICD-10-CM | POA: Diagnosis not present

## 2024-03-05 DIAGNOSIS — N200 Calculus of kidney: Secondary | ICD-10-CM | POA: Diagnosis not present

## 2024-03-05 DIAGNOSIS — Z1339 Encounter for screening examination for other mental health and behavioral disorders: Secondary | ICD-10-CM | POA: Diagnosis not present

## 2024-03-05 DIAGNOSIS — E669 Obesity, unspecified: Secondary | ICD-10-CM | POA: Diagnosis not present

## 2024-03-05 DIAGNOSIS — Z0000 Encounter for general adult medical examination without abnormal findings: Secondary | ICD-10-CM | POA: Diagnosis not present

## 2024-03-05 DIAGNOSIS — Z1331 Encounter for screening for depression: Secondary | ICD-10-CM | POA: Diagnosis not present

## 2024-03-05 DIAGNOSIS — K760 Fatty (change of) liver, not elsewhere classified: Secondary | ICD-10-CM | POA: Diagnosis not present

## 2024-03-05 DIAGNOSIS — Z23 Encounter for immunization: Secondary | ICD-10-CM | POA: Diagnosis not present

## 2024-03-05 DIAGNOSIS — E1159 Type 2 diabetes mellitus with other circulatory complications: Secondary | ICD-10-CM | POA: Diagnosis not present

## 2024-03-05 DIAGNOSIS — I700 Atherosclerosis of aorta: Secondary | ICD-10-CM | POA: Diagnosis not present

## 2024-03-07 ENCOUNTER — Other Ambulatory Visit: Payer: Self-pay

## 2024-03-07 ENCOUNTER — Ambulatory Visit: Admitting: Physical Medicine and Rehabilitation

## 2024-03-07 VITALS — BP 130/85

## 2024-03-07 DIAGNOSIS — M47819 Spondylosis without myelopathy or radiculopathy, site unspecified: Secondary | ICD-10-CM

## 2024-03-07 DIAGNOSIS — M47816 Spondylosis without myelopathy or radiculopathy, lumbar region: Secondary | ICD-10-CM

## 2024-03-07 MED ORDER — CEPHALEXIN 250 MG PO CAPS: 250.0000 mg | ORAL_CAPSULE | Freq: Two times a day (BID) | ORAL | 0 refills | Status: DC

## 2024-03-07 MED ORDER — BUPIVACAINE HCL 0.5 % IJ SOLN
3.0000 mL | Freq: Once | INTRAMUSCULAR | Status: AC
  Administered 2024-03-07: 3 mL

## 2024-03-07 NOTE — Progress Notes (Signed)
 Pain Scale   Average Pain 3 Patient advising he has chronic lower back pain that radiates to both legs at times, patient advising his pain is chronic        +Driver, -BT, -Dye Allergies.

## 2024-03-17 NOTE — Procedures (Signed)
 Lumbar Diagnostic Facet Joint Nerve Block with Fluoroscopic Guidance   Patient: Alex Hunter      Date of Birth: December 15, 1955 MRN: [id number] PCP: Stephane Leita DEL, MD      Visit Date: 03/07/2024   Universal Protocol:    Date/Time: 03/17/2510:48 AM  Consent Given By: the patient  Position: PRONE  Additional Comments: Vital signs were monitored before and after the procedure. Patient was prepped and draped in the usual sterile fashion. The correct patient, procedure, and site was verified.   Injection Procedure Details:   Procedure diagnoses:  1. Spondylosis without myelopathy or radiculopathy      Meds Administered:  Meds ordered this encounter  Medications   DISCONTD: cephALEXin (KEFLEX) 250 MG capsule    Sig: Take 1 capsule (250 mg total) by mouth 2 (two) times daily.    Dispense:  14 capsule    Refill:  0   bupivacaine  (MARCAINE ) 0.5 % (with pres) injection 3 mL     Laterality: Bilateral  Location/Site: L3-L4, L2 and L3 medial branches and L4-L5, L3 and L4 medial branches  Needle: 5.0 in., 25 ga.  Short bevel or Quincke spinal needle  Needle Placement: Oblique pedical  Findings:   -Comments: There was excellent flow of contrast along the articular pillars without intravascular flow.  Procedure Details: The fluoroscope beam is vertically oriented in AP and then obliqued 15 to 20 degrees to the ipsilateral side of the desired nerve to achieve the "Scotty dog" appearance.  The skin over the target area of the junction of the superior articulating process and the transverse process (sacral ala if blocking the L5 dorsal rami) was locally anesthetized with a 1 ml volume of 1% Lidocaine  without Epinephrine .  The spinal needle was inserted and advanced in a trajectory view down to the target.   After contact with periosteum and negative aspirate for blood and CSF, correct placement without intravascular or epidural spread was confirmed by injecting 0.5 ml. of Isovue-250.   A spot radiograph was obtained of this image.    Next, a 0.5 ml. volume of the injectate described above was injected. The needle was then redirected to the other facet joint nerves mentioned above if needed.  Prior to the procedure, the patient was given a Pain Diary which was completed for baseline measurements.  After the procedure, the patient rated their pain every 30 minutes and will continue rating at this frequency for a total of 5 hours.  The patient has been asked to complete the Diary and return to us  by mail, fax or hand delivered as soon as possible.   Additional Comments:  The patient tolerated the procedure well Dressing: 2 x 2 sterile gauze and Band-Aid    Post-procedure details: Patient was observed during the procedure. Post-procedure instructions were reviewed.  Patient left the clinic in stable condition.

## 2024-03-17 NOTE — Progress Notes (Signed)
 Alex Hunter - 68 y.o. male MRN [id number]  Date of birth: 05-22-1955  Office Visit Note: Visit Date: 03/07/2024 PCP: Stephane Leita DEL, MD Referred by: Stephane Leita DEL, MD  Subjective: Chief Complaint  Patient presents with   Lower Back - Pain   HPI:  Alex Hunter is a 68 y.o. male who comes in today at the request of Duwaine Pouch, FNP for planned Bilateral  L3-4 and L4-5 Lumbar facet/medial branch block with fluoroscopic guidance.  The patient has failed conservative care including home exercise, medications, time and activity modification.  This injection will be diagnostic and hopefully therapeutic.  Please see requesting physician notes for further details and justification.  Exam has shown concordant pain with facet joint loading.   ROS Otherwise per HPI.  Assessment & Plan: Visit Diagnoses:    ICD-10-CM   1. Spondylosis without myelopathy or radiculopathy  M47.819 XR C-ARM NO REPORT    Nerve Block    bupivacaine  (MARCAINE ) 0.5 % (with pres) injection 3 mL      Plan: No additional findings.   Meds & Orders:  Meds ordered this encounter  Medications   DISCONTD: cephALEXin (KEFLEX) 250 MG capsule    Sig: Take 1 capsule (250 mg total) by mouth 2 (two) times daily.    Dispense:  14 capsule    Refill:  0   bupivacaine  (MARCAINE ) 0.5 % (with pres) injection 3 mL    Orders Placed This Encounter  Procedures   Nerve Block   XR C-ARM NO REPORT    Follow-up: Return for Review Pain Diary.   Procedures: No procedures performed  Lumbar Diagnostic Facet Joint Nerve Block with Fluoroscopic Guidance   Patient: Alex Hunter      Date of Birth: 1955/10/08 MRN: [id number] PCP: Stephane Leita DEL, MD      Visit Date: 03/07/2024   Universal Protocol:    Date/Time: 03/17/2510:48 AM  Consent Given By: the patient  Position: PRONE  Additional Comments: Vital signs were monitored before and after the procedure. Patient was prepped and draped in the usual sterile  fashion. The correct patient, procedure, and site was verified.   Injection Procedure Details:   Procedure diagnoses:  1. Spondylosis without myelopathy or radiculopathy      Meds Administered:  Meds ordered this encounter  Medications   DISCONTD: cephALEXin (KEFLEX) 250 MG capsule    Sig: Take 1 capsule (250 mg total) by mouth 2 (two) times daily.    Dispense:  14 capsule    Refill:  0   bupivacaine  (MARCAINE ) 0.5 % (with pres) injection 3 mL     Laterality: Bilateral  Location/Site: L3-L4, L2 and L3 medial branches and L4-L5, L3 and L4 medial branches  Needle: 5.0 in., 25 ga.  Short bevel or Quincke spinal needle  Needle Placement: Oblique pedical  Findings:   -Comments: There was excellent flow of contrast along the articular pillars without intravascular flow.  Procedure Details: The fluoroscope beam is vertically oriented in AP and then obliqued 15 to 20 degrees to the ipsilateral side of the desired nerve to achieve the "Scotty dog" appearance.  The skin over the target area of the junction of the superior articulating process and the transverse process (sacral ala if blocking the L5 dorsal rami) was locally anesthetized with a 1 ml volume of 1% Lidocaine  without Epinephrine .  The spinal needle was inserted and advanced in a trajectory view down to the target.   After contact with periosteum and negative  aspirate for blood and CSF, correct placement without intravascular or epidural spread was confirmed by injecting 0.5 ml. of Isovue-250.  A spot radiograph was obtained of this image.    Next, a 0.5 ml. volume of the injectate described above was injected. The needle was then redirected to the other facet joint nerves mentioned above if needed.  Prior to the procedure, the patient was given a Pain Diary which was completed for baseline measurements.  After the procedure, the patient rated their pain every 30 minutes and will continue rating at this frequency for a total  of 5 hours.  The patient has been asked to complete the Diary and return to us  by mail, fax or hand delivered as soon as possible.   Additional Comments:  The patient tolerated the procedure well Dressing: 2 x 2 sterile gauze and Band-Aid    Post-procedure details: Patient was observed during the procedure. Post-procedure instructions were reviewed.  Patient left the clinic in stable condition.   Clinical History: EXAM: MRI LUMBAR SPINE 01/14/2024 07:34:55 AM   TECHNIQUE: Multiplanar multisequence MRI of the lumbar spine was performed without the administration of intravenous contrast.   COMPARISON: Lumbar spine radiographs 11/06/2023.   CLINICAL HISTORY: Spinal stenosis, lumbar; R/O HNP/FACET JOINT. Back hurts when standing up and sitting; NKI; Problem happening since back surgery.   FINDINGS:   BONES AND ALIGNMENT: Conventional lumbosacral anatomy is assumed with 5 non-rib-bearing, lumbar-type vertebral bodies. Benign vertebral hemangioma in the T12 and L4 vertebral bodies. Congenitally short pedicles throughout the lumbar spine.   SPINAL CORD: Conus terminates at L1.   SOFT TISSUES: No paraspinal mass.   L1-L2: No significant disc herniation. No spinal canal stenosis or neural foraminal narrowing.   L2-L3: Right eccentric disc bulge with central annular fissure results in mild spinal canal stenosis with moderate narrowing of the left subarticular zone resulting in mass effect on the traversing left L3 nerve root. No significant neural foraminal narrowing.   L3-L4: Disc bulge and moderate bilateral facet arthropathy with periarticular edema result in mild spinal canal stenosis and moderate right neural foraminal narrowing.   L4-L5: Disc bulge and moderate bilateral facet arthropathy with periarticular edema result in severe narrowing of the right lateral recess with compression of the traversing right L5 nerve root. No significant neural foraminal  narrowing.   L5-S1: Small disc bulge and mild bilateral facet arthropathy without spinal canal stenosis or neural foraminal narrowing.   IMPRESSION: 1. Multilevel lumbar spondylosis, with greatest severity at L4-5 causing severe right lateral recess stenosis. 2. At L2-3, moderate left subarticular zone narrowing with mass effect on the traversing left L3 nerve root. 3. Moderate bilateral facet arthropathy with periarticular edema at L3-L4 and L4-L5.   Electronically signed by: Ryan Chess MD 01/19/2024 08:45 AM EDT RP Workstation: [id number]     Objective:  VS:  HT:    WT:   BMI:     BP:130/85  HR: bpm  TEMP: ( )  RESP:  Physical Exam Vitals and nursing note reviewed.  Constitutional:      General: He is not in acute distress.    Appearance: Normal appearance. He is not ill-appearing.  HENT:     Head: Normocephalic and atraumatic.     Right Ear: External ear normal.     Left Ear: External ear normal.     Nose: No congestion.  Eyes:     Extraocular Movements: Extraocular movements intact.  Cardiovascular:     Rate and Rhythm: Normal rate.  Pulses: Normal pulses.  Pulmonary:     Effort: Pulmonary effort is normal. No respiratory distress.  Abdominal:     General: There is no distension.     Palpations: Abdomen is soft.  Musculoskeletal:        General: No tenderness or signs of injury.     Cervical back: Neck supple.     Right lower leg: No edema.     Left lower leg: No edema.     Comments: Patient has good distal strength without clonus.  Skin:    Findings: No erythema or rash.  Neurological:     General: No focal deficit present.     Mental Status: He is alert and oriented to person, place, and time.     Sensory: No sensory deficit.     Motor: No weakness or abnormal muscle tone.     Coordination: Coordination normal.  Psychiatric:        Mood and Affect: Mood normal.        Behavior: Behavior normal.      Imaging: No results found.

## 2024-04-11 ENCOUNTER — Ambulatory Visit: Admitting: Sports Medicine

## 2024-04-11 ENCOUNTER — Encounter: Payer: Self-pay | Admitting: Sports Medicine

## 2024-04-11 DIAGNOSIS — M65342 Trigger finger, left ring finger: Secondary | ICD-10-CM

## 2024-04-11 MED ORDER — BETAMETHASONE SOD PHOS & ACET 6 (3-3) MG/ML IJ SUSP
6.0000 mg | INTRAMUSCULAR | Status: AC | PRN
  Administered 2024-04-11: 6 mg via INTRA_ARTICULAR

## 2024-04-11 MED ORDER — LIDOCAINE HCL 1 % IJ SOLN
1.0000 mL | INTRAMUSCULAR | Status: AC | PRN
  Administered 2024-04-11: 1 mL

## 2024-04-11 NOTE — Progress Notes (Signed)
 Alex Hunter - 68 y.o. male MRN [id number]  Date of birth: 1955-09-25  Office Visit Note: Visit Date: 04/11/2024 PCP: Stephane Leita DEL, MD Referred by: Stephane Leita DEL, MD  Subjective: Chief Complaint  Patient presents with   Left Hand - Pain   HPI: Alex Hunter is a pleasant 68 y.o. male who presents today for left ring finger pain/triggering.  Left ring finger - [name] states that his left ring finger has been locking up for the last few months. He does have soreness in the hand. He says that he will wake up in the mornings and have to wait for the finger to unlock, and sometimes has to open it back up with the other hand. He wears a sleeve on his finger at all times recently, and says that his hand is open for much of the day throughout the day, so has not experienced much of that locking when awake. No injuries. His triggering is happening daily at this point.   Pertinent ROS were reviewed with the patient and found to be negative unless otherwise specified above in HPI.   Assessment & Plan: Visit Diagnoses:  1. Trigger finger, left ring finger    Plan: Impression it is left ring trigger finger which has been present for the last few months, now happening on a daily occurrence and Keri is having to passively open the finger in the mornings to release.  Discussed treatment such as finger sleeve/splinting, injection therapy, surgical release. Through shared decision making, did proceed with trigger finger injection, patient tolerated well.  May use ice and/or over-the-counter anti-inflammatories (Ibuprofen or Aleve ) in the short-term. I do think he would continue to find benefit with finger sleeve or bracing to help prevent flexion at nighttime.  I will see him back in 1 month to reevaluate.  Double Band-Aid splint applied to be worn for 72 hours.  Follow-up: Return in about 1 month (around 05/12/2024) for Left ring trigger finger.   Meds & Orders: No orders of the defined  types were placed in this encounter.   Orders Placed This Encounter  Procedures   Hand/UE Inj     Procedures: Hand/UE Inj: L ring A1 for trigger finger on 04/11/2024 8:55 AM Indications: pain and tendon swelling Details: 25 G needle, volar approach Medications: 1 mL lidocaine  1 %; 6 mg betamethasone  acetate-betamethasone  sodium phosphate  6 (3-3) MG/ML Outcome: tolerated well, no immediate complications  Trigger finger injection, left finger After discussion on risks/benefits/indications and informed verbal consent was obtained, a timeout was performed. The area of tenderness at the A1 pulley was identified with palpable nodule located and marked. This area was cleaned with Betadine  swab and alcohol swabs. After sterile precautions were taken, a 25-gauge, 5/8 needle was inserted into the A1 pulley with 1.0cc:1.0cc mixture of Lidocaine  1% and Betamethasone  6mg /mL. Patient tolerated procedure well and was observed for at least 5 minutes after injection with resolution of pain and improved ROM.  Procedure, treatment alternatives, risks and benefits explained, specific risks discussed. Consent was given by the patient. Immediately prior to procedure a time out was called to verify the correct patient, procedure, equipment, support staff and site/side marked as required. Patient was prepped and draped in the usual sterile fashion.          Clinical History:  He reports that he quit smoking about 36 years ago. His smoking use included cigarettes. He started smoking about 46 years ago. He has a 5 pack-year smoking history. He  has never used smokeless tobacco.  Recent Labs    04/14/23 0830  HGBA1C 6.3*    Objective:    Physical Exam  Gen: Well-appearing, in no acute distress; non-toxic CV: Well-perfused. Warm.  Resp: Breathing unlabored on room air; no wheezing. Psych: Fluid speech in conversation; appropriate affect; normal thought process  Ortho Exam - Left ring finger: + Mild  thickening TTP over the A1 pulley.  There is start of catching but no active triggering noted on exam today.  No effusion or tenosynovitis appreciated.  Imaging: No results found.  Past Medical/Family/Surgical/Social History: Medications & Allergies reviewed per EMR, new medications updated. Patient Active Problem List   Diagnosis Date Noted   Status post total replacement of left hip 04/21/2023   Trochanteric bursitis of left hip 06/03/2017   Essential hypertension 03/06/2012   Perirectal abscess 03/06/2012   Hyperlipidemia 11/19/2009   Past Medical History:  Diagnosis Date   Anxiety    Arthritis    right hip (01/09/2017)   Asthma    DM (diabetes mellitus) (HCC)    High blood pressure    High cholesterol    History of kidney stones    Family History  Problem Relation Age of Onset   Heart disease Mother    Leukemia Brother    Heart attack Brother    Colon cancer Neg Hx    Colon polyps Neg Hx    Esophageal cancer Neg Hx    Rectal cancer Neg Hx    Stomach cancer Neg Hx    Past Surgical History:  Procedure Laterality Date   COLONOSCOPY  06/21/2022   COLONOSCOPY  09/03/2009   F/V, Lamar Aho, excellent prep, normal   DENTAL SURGERY     had an implant (01/09/2017)   TOTAL HIP ARTHROPLASTY Right 01/09/2017   TOTAL HIP ARTHROPLASTY Right 01/09/2017   Procedure: RIGHT TOTAL HIP ARTHROPLASTY ANTERIOR APPROACH;  Surgeon: Fidel Rogue, MD;  Location: [facility] OR;  Service: Orthopedics;  Laterality: Right;   TOTAL HIP ARTHROPLASTY Left 04/21/2023   Procedure: LEFT TOTAL HIP ARTHROPLASTY ANTERIOR APPROACH;  Surgeon: Vernetta Lonni GRADE, MD;  Location: [facility];  Service: Orthopedics;  Laterality: Left;   Social History   Occupational History   Not on file  Tobacco Use   Smoking status: Former    Current packs/day: 0.00    Average packs/day: 0.5 packs/day for 10.0 years (5.0 ttl pk-yrs)    Types: Cigarettes    Start date: 56    Quit date: 1989    Years since  quitting: 36.9   Smokeless tobacco: Never   Tobacco comments:    quit smoking in the 1980s  Vaping Use   Vaping status: Never Used  Substance and Sexual Activity   Alcohol use: Not Currently    Comment: once every year   Drug use: Not Currently    Types: Cocaine    Comment: went thru treatment back in the 1980s   Sexual activity: Yes

## 2024-04-11 NOTE — Progress Notes (Signed)
 Patient says that his left ring finger has been locking up for the last few months. He does have soreness in the hand. He says that he will wake up in the mornings and have to wait for the finger to unlock, and sometimes has to open it back up with the other hand. He wears a sleeve on his finger at all times, and says that his hand is open for much of the day throughout the day, so has not experienced much of that locking when awake.

## 2024-05-09 ENCOUNTER — Ambulatory Visit: Admitting: Sports Medicine
